# Patient Record
Sex: Male | Born: 1950 | Race: Black or African American | Hispanic: No | Marital: Married | Smoking: Former smoker
Health system: Southern US, Community
[De-identification: ages and names within clinical notes are randomized; demographics above are authoritative.]

## PROBLEM LIST (undated history)

## (undated) DIAGNOSIS — I10 Essential (primary) hypertension: Secondary | ICD-10-CM

## (undated) DIAGNOSIS — M549 Dorsalgia, unspecified: Secondary | ICD-10-CM

## (undated) HISTORY — PX: BACK SURGERY: SHX140

---

## 2004-02-19 ENCOUNTER — Emergency Department (HOSPITAL_COMMUNITY): Admission: EM | Admit: 2004-02-19 | Discharge: 2004-02-20 | Payer: Self-pay | Admitting: *Deleted

## 2004-04-04 ENCOUNTER — Ambulatory Visit (HOSPITAL_COMMUNITY): Admission: RE | Admit: 2004-04-04 | Discharge: 2004-04-04 | Payer: Self-pay | Admitting: Gastroenterology

## 2006-10-11 ENCOUNTER — Encounter: Admission: RE | Admit: 2006-10-11 | Discharge: 2006-10-11 | Payer: Self-pay | Admitting: Surgery

## 2006-10-12 ENCOUNTER — Ambulatory Visit (HOSPITAL_COMMUNITY): Admission: RE | Admit: 2006-10-12 | Discharge: 2006-10-12 | Payer: Self-pay | Admitting: Surgery

## 2011-12-24 ENCOUNTER — Ambulatory Visit (INDEPENDENT_AMBULATORY_CARE_PROVIDER_SITE_OTHER): Payer: BC Managed Care – PPO | Admitting: Emergency Medicine

## 2011-12-24 VITALS — BP 132/76 | HR 57 | Temp 98.2°F | Resp 16 | Ht 69.5 in | Wt 181.4 lb

## 2011-12-24 DIAGNOSIS — L247 Irritant contact dermatitis due to plants, except food: Secondary | ICD-10-CM

## 2011-12-24 DIAGNOSIS — L255 Unspecified contact dermatitis due to plants, except food: Secondary | ICD-10-CM

## 2011-12-24 MED ORDER — METHYLPREDNISOLONE ACETATE 40 MG/ML INJ SUSP (RADIOLOG
120.0000 mg | Freq: Once | INTRAMUSCULAR | Status: AC
  Administered 2011-12-24: 120 mg via INTRAMUSCULAR

## 2011-12-24 NOTE — Progress Notes (Signed)
   Patient Name: Eddie Dyer Date of Birth: [birth year] Medical Record Number: [id number] Gender: male Date of Encounter: 12/24/2011  Chief Complaint: Rash   History of Present Illness:  Eddie Dyer is a 61 y.o. very pleasant male patient who presents with the following:  Started HCTZ three months ago and has a pruritic raised migratory rash for the past two months.  Seen by a dermatologist a week ago who told him likely allergic.   Put on steroid cream and benadryl with no improvement.  Not able to sleep due to itching.  No other new allergens  There is no problem list on file for this patient.  No past medical history on file. No past surgical history on file. History  Substance Use Topics  . Smoking status: Former Games developer  . Smokeless tobacco: Not on file  . Alcohol Use: Not on file   No family history on file. No Known Allergies  Medication list has been reviewed and updated.  Current Outpatient Prescriptions on File Prior to Visit  Medication Sig Dispense Refill  . enalapril (VASOTEC) 10 MG tablet Take 10 mg by mouth daily.      . hydrochlorothiazide (HYDRODIURIL) 25 MG tablet Take 25 mg by mouth daily.        Review of Systems:  As per HPI, otherwise negative.    Physical Examination: Filed Vitals:   12/24/11 0900  BP: 132/76  Pulse: 57  Temp: 98.2 F (36.8 C)  Resp: 16   Filed Vitals:   12/24/11 0900  Height: 5' 9.5" (1.765 m)  Weight: 181 lb 6.4 oz (82.283 kg)   Body mass index is 26.40 kg/(m^2). Ideal Body Weight: Weight in (lb) to have BMI = 25: 171.4    GEN: WDWN, NAD, Non-toxic, Alert & Oriented x 3 HEENT: Atraumatic, Normocephalic.  Ears and Nose: No external deformity. EXTR: No clubbing/cyanosis/edema NEURO: Normal gait.  PSYCH: Normally interactive. Conversant. Not depressed or anxious appearing.  Calm demeanor.  Generalized excoriated erythematous eruption  EKG / Labs / Xrays: None available at time of encounter  Assessment and  Plan:   Allergic dermatitis.  Stop HCTZ  Carmelina Dane, MD

## 2014-09-07 DIAGNOSIS — H2513 Age-related nuclear cataract, bilateral: Secondary | ICD-10-CM | POA: Diagnosis not present

## 2014-09-07 DIAGNOSIS — H524 Presbyopia: Secondary | ICD-10-CM | POA: Diagnosis not present

## 2014-09-07 DIAGNOSIS — H5203 Hypermetropia, bilateral: Secondary | ICD-10-CM | POA: Diagnosis not present

## 2014-09-08 DIAGNOSIS — J069 Acute upper respiratory infection, unspecified: Secondary | ICD-10-CM | POA: Diagnosis not present

## 2014-09-08 DIAGNOSIS — L509 Urticaria, unspecified: Secondary | ICD-10-CM | POA: Diagnosis not present

## 2014-09-08 DIAGNOSIS — J300 Vasomotor rhinitis: Secondary | ICD-10-CM | POA: Diagnosis not present

## 2014-11-17 DIAGNOSIS — I10 Essential (primary) hypertension: Secondary | ICD-10-CM | POA: Diagnosis not present

## 2014-11-23 DIAGNOSIS — L509 Urticaria, unspecified: Secondary | ICD-10-CM | POA: Diagnosis not present

## 2014-11-23 DIAGNOSIS — R05 Cough: Secondary | ICD-10-CM | POA: Diagnosis not present

## 2014-11-23 DIAGNOSIS — J300 Vasomotor rhinitis: Secondary | ICD-10-CM | POA: Diagnosis not present

## 2015-04-13 ENCOUNTER — Other Ambulatory Visit: Payer: Self-pay | Admitting: Family Medicine

## 2015-04-13 DIAGNOSIS — I714 Abdominal aortic aneurysm, without rupture, unspecified: Secondary | ICD-10-CM

## 2015-04-20 ENCOUNTER — Ambulatory Visit: Payer: Self-pay

## 2016-09-06 ENCOUNTER — Encounter (HOSPITAL_COMMUNITY): Payer: Self-pay | Admitting: Emergency Medicine

## 2016-09-06 ENCOUNTER — Emergency Department (HOSPITAL_COMMUNITY): Payer: Medicare Other

## 2016-09-06 ENCOUNTER — Emergency Department (HOSPITAL_COMMUNITY)
Admission: EM | Admit: 2016-09-06 | Discharge: 2016-09-06 | Disposition: A | Discharge status: Home / Self Care | Payer: Medicare Other | Attending: Emergency Medicine | Admitting: Emergency Medicine

## 2016-09-06 DIAGNOSIS — Z87891 Personal history of nicotine dependence: Secondary | ICD-10-CM | POA: Diagnosis not present

## 2016-09-06 DIAGNOSIS — X509XXA Other and unspecified overexertion or strenuous movements or postures, initial encounter: Secondary | ICD-10-CM | POA: Diagnosis not present

## 2016-09-06 DIAGNOSIS — Z7982 Long term (current) use of aspirin: Secondary | ICD-10-CM | POA: Insufficient documentation

## 2016-09-06 DIAGNOSIS — Y999 Unspecified external cause status: Secondary | ICD-10-CM | POA: Insufficient documentation

## 2016-09-06 DIAGNOSIS — M545 Low back pain, unspecified: Secondary | ICD-10-CM

## 2016-09-06 DIAGNOSIS — I10 Essential (primary) hypertension: Secondary | ICD-10-CM | POA: Insufficient documentation

## 2016-09-06 DIAGNOSIS — Y939 Activity, unspecified: Secondary | ICD-10-CM | POA: Diagnosis not present

## 2016-09-06 DIAGNOSIS — Y929 Unspecified place or not applicable: Secondary | ICD-10-CM | POA: Insufficient documentation

## 2016-09-06 HISTORY — DX: Essential (primary) hypertension: I10

## 2016-09-06 HISTORY — DX: Dorsalgia, unspecified: M54.9

## 2016-09-06 LAB — I-STAT CHEM 8, ED
BUN: 25 mg/dL — AB (ref 6–20)
CALCIUM ION: 1.08 mmol/L — AB (ref 1.15–1.40)
Chloride: 103 mmol/L (ref 101–111)
Creatinine, Ser: 1 mg/dL (ref 0.61–1.24)
Glucose, Bld: 100 mg/dL — ABNORMAL HIGH (ref 65–99)
HCT: 43 % (ref 39.0–52.0)
HEMOGLOBIN: 14.6 g/dL (ref 13.0–17.0)
Potassium: 4.6 mmol/L (ref 3.5–5.1)
SODIUM: 139 mmol/L (ref 135–145)
TCO2: 28 mmol/L (ref 0–100)

## 2016-09-06 LAB — I-STAT TROPONIN, ED: TROPONIN I, POC: 0 ng/mL (ref 0.00–0.08)

## 2016-09-06 MED ORDER — OXYCODONE-ACETAMINOPHEN 5-325 MG PO TABS
1.0000 | ORAL_TABLET | ORAL | Status: AC | PRN
Start: 2016-09-06 — End: 2016-09-06
  Administered 2016-09-06 (×2): 1 via ORAL
  Filled 2016-09-06: qty 1

## 2016-09-06 MED ORDER — PREDNISONE 10 MG (21) PO TBPK: ORAL_TABLET | Freq: Every day | ORAL | 0 refills | Status: DC

## 2016-09-06 MED ORDER — OXYCODONE-ACETAMINOPHEN 5-325 MG PO TABS
ORAL_TABLET | ORAL | Status: AC
  Filled 2016-09-06: qty 1

## 2016-09-06 MED ORDER — CYCLOBENZAPRINE HCL 10 MG PO TABS: 10.0000 mg | ORAL_TABLET | Freq: Three times a day (TID) | ORAL | 0 refills | Status: AC | PRN

## 2016-09-06 MED ORDER — KETOROLAC TROMETHAMINE 60 MG/2ML IM SOLN
30.0000 mg | Freq: Once | INTRAMUSCULAR | Status: AC
  Administered 2016-09-06: 30 mg via INTRAMUSCULAR
  Filled 2016-09-06: qty 2

## 2016-09-06 NOTE — ED Provider Notes (Signed)
[facility]-EMERGENCY DEPT Provider Note   CSN: [id number] Arrival date & time: 09/06/16  0131     History   Chief Complaint Chief Complaint  Patient presents with  . Back Pain    HPI Eddie Dyer is a 66 y.o. male who presents today with 2 day history of right sided low back pain. Pt states he was gardening yesterday and turned to reach for something behind him when he felt "my back seize". States he has 8/10 right sided sharp pain whenever he stands up or changed positions that require him to flex his hip. Associated with intermittent anterior thigh numbness but no weakness or tinglingStates he experienced a similar episode 3 years ago and says "I was told I had a bone spur in my back". He has tried voltaren with no relief. Denies bowel/bladder incontinence, gait abnormalities or coordination difficulties, fever/chills, abd pain, n/v/d, dysuria, hematuria. Also denies HA, dizziness, CP/SOB, melena. No hx of kidney stones.   The history is provided by the patient.    Past Medical History:  Diagnosis Date  . Back pain   . Hypertension     There are no active problems to display for this patient.   Past Surgical History:  Procedure Laterality Date  . BACK SURGERY         Home Medications    Prior to Admission medications   Medication Sig Start Date End Date Taking? Authorizing Provider  aspirin 81 MG tablet Take 81 mg by mouth daily.   Yes Historical Provider, MD  diclofenac (VOLTAREN) 75 MG EC tablet Take 75 mg by mouth 2 (two) times daily as needed for mild pain.   Yes Historical Provider, MD  enalapril (VASOTEC) 10 MG tablet Take 20 mg by mouth daily.    Yes Historical Provider, MD  hydrochlorothiazide (HYDRODIURIL) 25 MG tablet Take 12.5 mg by mouth daily.    Yes Historical Provider, MD  Multiple Vitamins-Minerals (MULTIVITAMIN WITH MINERALS) tablet Take 1 tablet by mouth daily.   Yes Historical Provider, MD  cyclobenzaprine (FLEXERIL) 10 MG tablet Take 1 tablet (10 mg  total) by mouth 3 (three) times daily as needed for muscle spasms. 09/06/16 09/09/16  Epic Tribbett A Malon Branton, PA  predniSONE (STERAPRED UNI-PAK 21 TAB) 10 MG (21) TBPK tablet Take by mouth daily. Take 6 tabs by mouth daily  for 2 days, then 5 tabs for 2 days, then 4 tabs for 2 days, then 3 tabs for 2 days, 2 tabs for 2 days, then 1 tab by mouth daily for 2 days 09/06/16   Jeanie Sewer, PA    Family History History reviewed. No pertinent family history.  Social History Social History  Substance Use Topics  . Smoking status: Former Games developer  . Smokeless tobacco: Not on file  . Alcohol use Yes     Comment: occ     Allergies   Patient has no known allergies.   Review of Systems Review of Systems  Constitutional: Negative for chills and fever.  Respiratory: Negative for shortness of breath.   Cardiovascular: Negative for chest pain and leg swelling.  Gastrointestinal: Negative for abdominal pain, constipation, diarrhea, nausea and vomiting.  Genitourinary: Negative for dysuria, flank pain and hematuria.  Musculoskeletal: Positive for arthralgias, back pain and myalgias. Negative for gait problem, joint swelling and neck pain.  Neurological: Positive for numbness. Negative for dizziness and headaches.     Physical Exam Updated Vital Signs BP (!) 151/90   Pulse (!) 54   Temp 98.3 F (  36.8 C) (Oral)   Resp 18   Ht 5\' 10"  (1.778 m)   Wt 81.6 kg   SpO2 94%   BMI 25.83 kg/m   Physical Exam  Constitutional: He is oriented to person, place, and time. He appears well-developed and well-nourished. No distress.  HENT:  Head: Normocephalic and atraumatic.  Eyes: Conjunctivae are normal. Right eye exhibits no discharge. Left eye exhibits no discharge. No scleral icterus.  Neck: Neck supple. No JVD present. No tracheal deviation present. No thyromegaly present.  Cardiovascular: Normal rate, regular rhythm, normal heart sounds and intact distal pulses.   2+ dp/pt pulses BL  Pulmonary/Chest: Effort  normal and breath sounds normal.  Abdominal: Soft. He exhibits no distension. There is no tenderness.  Genitourinary:  Genitourinary Comments: No CVA tenderness or flank pain  Musculoskeletal: He exhibits tenderness. He exhibits no edema or deformity.  ROM of hips BL normal, negative FABER test. TTP at right SI joint, SI dysfunction noted on flexion of LSP. No ttp of midline spine. Negative straight leg raise. 5/5 strength in BLE. Antalgic gait, but pt able to heel walk and toe walk without balance or coordination issues  Neurological: He is alert and oriented to person, place, and time. No sensory deficit.  Skin: Skin is warm and dry. Capillary refill takes less than 2 seconds. He is not diaphoretic.  Psychiatric: He has a normal mood and affect. His behavior is normal. Judgment and thought content normal.     ED Treatments / Results  Labs (all labs ordered are listed, but only abnormal results are displayed) Labs Reviewed  I-STAT CHEM 8, ED - Abnormal; Notable for the following:       Result Value   BUN 25 (*)    Glucose, Bld 100 (*)    Calcium, Ion 1.08 (*)    All other components within normal limits  I-STAT TROPOININ, ED    EKG  EKG Interpretation None       Radiology Dg Lumbar Spine Complete  Result Date: 09/06/2016 CLINICAL DATA:  Twisted back few days ago Felt sharp pain in lower back and post rt leg pain is worse toady. EXAM: LUMBAR SPINE - COMPLETE 4+ VIEW COMPARISON:  None. FINDINGS: There are 5 nonrib bearing lumbar-type vertebral bodies. The vertebral body heights are maintained. 2 mm anterolisthesis of L3 on L4. There is a mild levocurvature of the lumbar spine. There is no spondylolysis. There is no acute fracture. There is degenerative disc disease with disc height loss at L4-5 and L5-S1 with bilateral facet arthropathy. Bilateral facet arthropathy at L3-4. The SI joints are unremarkable. IMPRESSION: 1.  No acute osseous injury of the lumbar spine. Electronically  Signed   By: Elige KoHetal  Patel   On: 09/06/2016 08:06    Procedures Procedures (including critical care time)  Medications Ordered in ED Medications  oxyCODONE-acetaminophen (PERCOCET/ROXICET) 5-325 MG per tablet (not administered)  oxyCODONE-acetaminophen (PERCOCET/ROXICET) 5-325 MG per tablet 1 tablet (1 tablet Oral Given 09/06/16 0722)  ketorolac (TORADOL) injection 30 mg (30 mg Intramuscular Given 09/06/16 0850)     Initial Impression / Assessment and Plan / ED Course  I have reviewed the triage vital signs and the nursing notes.  Pertinent labs & imaging results that were available during my care of the patient were reviewed by me and considered in my medical decision making (see chart for details).     66yom presents to ED with chief complaint low back pain since yesterday, with onset after gardening and turning to  grab something. Pt afebrile, VS baseline for pt. LSP xrays show no acute osseous injury but degenerative discs and facet arthropathy.  Pt received percocet in ED and toradol IM which he states was helpful.  In absence of gait abnormalities, bowel/bladder incontinence, and fever low suspicion of CES or spinal abscess. Low suspicion of nehprolithiasis, pyelonephritis, or acute abdominal pathology in absence of fever, CVA tenderness/flank pain, or abdominal pain. Likely myofascial in nature or sacroillitis. Pt will take steroid taper for inflammation, and provided with flexeril prn for muscle spasms. Pt will follow up with PCP within 1 week for re-evaluation; discussed the role of physical therapy. Discussed ED return precautions including development of fever, bowel/bladder incontinence, and gait disturbances.  Final Clinical Impressions(s) / ED Diagnoses   Final diagnoses:  Acute right-sided low back pain without sciatica    New Prescriptions New Prescriptions   CYCLOBENZAPRINE (FLEXERIL) 10 MG TABLET    Take 1 tablet (10 mg total) by mouth 3 (three) times daily as needed  for muscle spasms.   PREDNISONE (STERAPRED UNI-PAK 21 TAB) 10 MG (21) TBPK TABLET    Take by mouth daily. Take 6 tabs by mouth daily  for 2 days, then 5 tabs for 2 days, then 4 tabs for 2 days, then 3 tabs for 2 days, 2 tabs for 2 days, then 1 tab by mouth daily for 2 days     Jeanie Sewer, [place] 09/06/16 1002    Tomasita Crumble, MD 09/06/16 1222

## 2016-09-06 NOTE — ED Notes (Signed)
Patient states he has "a bone spur in his back " reached around to get something yest and felt a catch in the left side of his back . States he just couldn't get comfortable. States since being in the ed and he was given a pain pill he feels much better.;

## 2016-09-06 NOTE — ED Triage Notes (Signed)
Pt presents with R flank pain that began yesterday evening; pt denies urinary symptoms, denies n/v/d, denies fever, diaphoresis, lightheadedness, also denies injury

## 2016-09-06 NOTE — ED Notes (Signed)
Patient transported to X-ray 

## 2016-09-06 NOTE — ED Notes (Signed)
Returned from  X-ray

## 2016-09-14 ENCOUNTER — Other Ambulatory Visit: Payer: Self-pay | Admitting: Family Medicine

## 2016-09-14 DIAGNOSIS — Z136 Encounter for screening for cardiovascular disorders: Secondary | ICD-10-CM

## 2016-09-25 ENCOUNTER — Ambulatory Visit: Payer: Medicare (Managed Care)

## 2018-05-06 ENCOUNTER — Ambulatory Visit
Admission: RE | Admit: 2018-05-06 | Discharge: 2018-05-06 | Disposition: A | Discharge status: Home / Self Care | Payer: Medicare Other | Source: Ambulatory Visit | Attending: Family Medicine | Admitting: Family Medicine

## 2018-05-06 ENCOUNTER — Other Ambulatory Visit: Payer: Self-pay | Admitting: Family Medicine

## 2018-05-06 DIAGNOSIS — J329 Chronic sinusitis, unspecified: Secondary | ICD-10-CM

## 2020-10-16 ENCOUNTER — Emergency Department (HOSPITAL_COMMUNITY)
Admission: EM | Admit: 2020-10-16 | Discharge: 2020-10-17 | Disposition: A | Discharge status: Home / Self Care | Payer: Medicare Other | Attending: Emergency Medicine | Admitting: Emergency Medicine

## 2020-10-16 ENCOUNTER — Emergency Department (HOSPITAL_COMMUNITY): Payer: Medicare Other

## 2020-10-16 ENCOUNTER — Other Ambulatory Visit: Payer: Self-pay

## 2020-10-16 ENCOUNTER — Encounter (HOSPITAL_COMMUNITY): Payer: Self-pay

## 2020-10-16 DIAGNOSIS — Z79899 Other long term (current) drug therapy: Secondary | ICD-10-CM | POA: Diagnosis not present

## 2020-10-16 DIAGNOSIS — R1013 Epigastric pain: Secondary | ICD-10-CM

## 2020-10-16 DIAGNOSIS — R0602 Shortness of breath: Secondary | ICD-10-CM | POA: Insufficient documentation

## 2020-10-16 DIAGNOSIS — R109 Unspecified abdominal pain: Secondary | ICD-10-CM | POA: Diagnosis present

## 2020-10-16 DIAGNOSIS — R064 Hyperventilation: Secondary | ICD-10-CM | POA: Diagnosis not present

## 2020-10-16 DIAGNOSIS — I10 Essential (primary) hypertension: Secondary | ICD-10-CM | POA: Diagnosis not present

## 2020-10-16 DIAGNOSIS — Z7982 Long term (current) use of aspirin: Secondary | ICD-10-CM | POA: Insufficient documentation

## 2020-10-16 DIAGNOSIS — K5790 Diverticulosis of intestine, part unspecified, without perforation or abscess without bleeding: Secondary | ICD-10-CM

## 2020-10-16 DIAGNOSIS — K5900 Constipation, unspecified: Secondary | ICD-10-CM | POA: Diagnosis not present

## 2020-10-16 DIAGNOSIS — Z87891 Personal history of nicotine dependence: Secondary | ICD-10-CM | POA: Insufficient documentation

## 2020-10-16 LAB — CBC
HCT: 41.1 % (ref 39.0–52.0)
Hemoglobin: 13.6 g/dL (ref 13.0–17.0)
MCH: 32.5 pg (ref 26.0–34.0)
MCHC: 33.1 g/dL (ref 30.0–36.0)
MCV: 98.1 fL (ref 80.0–100.0)
Platelets: 224 10*3/uL (ref 150–400)
RBC: 4.19 MIL/uL — ABNORMAL LOW (ref 4.22–5.81)
RDW: 11.7 % (ref 11.5–15.5)
WBC: 11.9 10*3/uL — ABNORMAL HIGH (ref 4.0–10.5)
nRBC: 0 % (ref 0.0–0.2)

## 2020-10-16 LAB — COMPREHENSIVE METABOLIC PANEL
ALT: 19 U/L (ref 0–44)
AST: 26 U/L (ref 15–41)
Albumin: 3.8 g/dL (ref 3.5–5.0)
Alkaline Phosphatase: 53 U/L (ref 38–126)
Anion gap: 10 (ref 5–15)
BUN: 22 mg/dL (ref 8–23)
CO2: 22 mmol/L (ref 22–32)
Calcium: 8.6 mg/dL — ABNORMAL LOW (ref 8.9–10.3)
Chloride: 102 mmol/L (ref 98–111)
Creatinine, Ser: 1.08 mg/dL (ref 0.61–1.24)
GFR, Estimated: >60 mL/min (ref >60)
Glucose, Bld: 138 mg/dL — ABNORMAL HIGH (ref 70–99)
Potassium: 3.8 mmol/L (ref 3.5–5.1)
Sodium: 134 mmol/L — ABNORMAL LOW (ref 135–145)
Total Bilirubin: 0.7 mg/dL (ref 0.3–1.2)
Total Protein: 6.7 g/dL (ref 6.5–8.1)

## 2020-10-16 LAB — LIPASE, BLOOD: Lipase: 28 U/L (ref 11–51)

## 2020-10-16 LAB — TROPONIN I (HIGH SENSITIVITY)
Troponin I (High Sensitivity): 5 ng/L (ref <18)
Troponin I (High Sensitivity): 4 ng/L (ref <18)

## 2020-10-16 MED ORDER — FAMOTIDINE 20 MG PO TABS
20.0000 mg | ORAL_TABLET | Freq: Once | ORAL | Status: AC
  Administered 2020-10-16: 20 mg via ORAL
  Filled 2020-10-16: qty 1

## 2020-10-16 MED ORDER — ALUM & MAG HYDROXIDE-SIMETH 200-200-20 MG/5ML PO SUSP
30.0000 mL | Freq: Once | ORAL | Status: AC
  Administered 2020-10-16: 30 mL via ORAL
  Filled 2020-10-16: qty 30

## 2020-10-16 MED ORDER — IOHEXOL 300 MG/ML  SOLN
80.0000 mL | Freq: Once | INTRAMUSCULAR | Status: AC | PRN
  Administered 2020-10-16: 80 mL via INTRAVENOUS

## 2020-10-16 NOTE — ED Triage Notes (Signed)
Emergency Medicine Provider Triage Evaluation Note  Eddie Dyer , a 70 y.o. male  was evaluated in triage.  Pt complains of indigestion, ?heartburn, burping.   Review of Systems  Positive:       Heartburn, indigestion, felt lightheaded Negative: No Palpitations. No Sob. No abd pain. Had bm this AM  Physical Exam  BP (!) 155/98 (BP Location: Right Arm)   Pulse 72   Temp 97.9 F (36.6 C) (Oral)   Resp 18   SpO2 100%  Gen:   Awake, no distress   HEENT:  Atraumatic  Resp:  Normal effort  Cardiac:  Normal rate  Abd:   Nondistended, nontender  Neuro:  Speech clear   Medical Decision Making  Medically screening exam initiated at 7:08 PM.  Appropriate orders placed.  Eddie Dyer was informed that the remainder of the evaluation will be completed by another provider, this initial triage assessment does not replace that evaluation, and the importance of remaining in the ED until their evaluation is complete.  Clinical Impression  ECG. Labs ordered. Will move to treatment room as soon as available.   pepcid and maalox for symptom relief.    Cathren Laine, MD 10/16/20 1910

## 2020-10-16 NOTE — ED Triage Notes (Signed)
Patient arrives with [facility], was driving down the road and had sudden onset shortness of breath, indigestion, burping, states his hands went numb and he felt dizzy. Reports two episodes since initial episode. Denies chest pain, hx of HTN. EMS gave 324mg  ASA, 20 L wrist.

## 2020-10-17 MED ORDER — OMEPRAZOLE 20 MG PO CPDR: 20.0000 mg | DELAYED_RELEASE_CAPSULE | Freq: Every day | ORAL | 0 refills | Status: DC

## 2020-10-17 NOTE — ED Provider Notes (Signed)
[facility] EMERGENCY DEPARTMENT Provider Note   CSN: [id number] Arrival date & time: 10/16/20  1902     History Chief Complaint  Patient presents with  . Shortness of Breath  . Gastroesophageal Reflux  . Constipation    Eddie Dyer is a 70 y.o. male.  HPI Patient reports he had been working outside around the [employer] most of the day.  He has been cleaning and painting.  He had not had any symptoms.  He went in changed and showered feeling normal.  He and his wife were in the car driving to dinner when he suddenly felt like he was having indigestion with a fullness in his upper abdomen.  He was having a lot of belching and then started to feel like he was slightly dizzy and short of breath.  He pulled to the side of the road and he felt like both hands were tingling and then they both kind of cramped and he could not close them.  They called EMS.  Symptoms were improving by the time EMS arrived.  Patient reports he had not eaten anything since breakfast.  He had been taking a 2-week course of Prilosec but has not had any in about 3 weeks.  Reports he has been feeling persistently like he is got some abdominal distention.  He has perceived himself to be constipated.  He reports at baseline he has 3 bowel movements a day.  He reports that he has only had 1 or 2 bowel movements now and thinks that he might be constipated.     Past Medical History:  Diagnosis Date  . Back pain   . Hypertension     There are no problems to display for this patient.   Past Surgical History:  Procedure Laterality Date  . BACK SURGERY         History reviewed. No pertinent family history.  Social History   Tobacco Use  . Smoking status: Former Games developer  . Smokeless tobacco: Never Used  Substance Use Topics  . Alcohol use: Yes    Comment: occ  . Drug use: No    Home Medications Prior to Admission medications   Medication Sig Start Date End Date Taking? Authorizing Provider   aspirin 81 MG tablet Take 81 mg by mouth daily.   Yes [provider]  diclofenac (VOLTAREN) 75 MG EC tablet Take 75 mg by mouth 2 (two) times daily as needed for mild pain.   Yes [provider]  enalapril (VASOTEC) 10 MG tablet Take 20 mg by mouth daily.    Yes [provider]  fluticasone (FLONASE) 50 MCG/ACT nasal spray Place 1 spray into both nostrils daily as needed for allergies.   Yes [provider]  hydrochlorothiazide (HYDRODIURIL) 25 MG tablet Take 12.5 mg by mouth daily.    Yes [provider]  Lactobacillus (ACIDOPHILUS PO) Take 1 capsule by mouth daily.   Yes [provider]  Multiple Vitamins-Minerals (MULTIVITAMIN WITH MINERALS) tablet Take 1 tablet by mouth daily.   Yes [provider]  omeprazole (PRILOSEC) 20 MG capsule Take 1 capsule (20 mg total) by mouth daily. 10/17/20  Yes Arby Barrette, MD    Allergies    Patient has no known allergies.  Review of Systems   Review of Systems 10 systems reviewed and negative except as per HPI Physical Exam Updated Vital Signs BP (!) 142/77   Pulse (!) 54   Temp 97.9 F (36.6 C) (Oral)  Resp 20   Ht 5\' 10"  (1.778 m)   Wt 80.7 kg   SpO2 98%   BMI 25.54 kg/m   Physical Exam Constitutional:      Appearance: He is well-developed.  HENT:     Head: Normocephalic and atraumatic.  Eyes:     Pupils: Pupils are equal, round, and reactive to light.  Cardiovascular:     Rate and Rhythm: Normal rate and regular rhythm.     Heart sounds: Normal heart sounds.  Pulmonary:     Effort: Pulmonary effort is normal.     Breath sounds: Normal breath sounds.  Abdominal:     General: Bowel sounds are normal. There is no distension.     Palpations: Abdomen is soft.     Tenderness: There is no abdominal tenderness.  Musculoskeletal:        General: Normal range of motion.     Cervical back: Neck supple.  Skin:    General: Skin is warm and dry.  Neurological:     Mental  Status: He is alert and oriented to person, place, and time.     GCS: GCS eye subscore is 4. GCS verbal subscore is 5. GCS motor subscore is 6.     Coordination: Coordination normal.     ED Results / Procedures / Treatments   Labs (all labs ordered are listed, but only abnormal results are displayed) Labs Reviewed  CBC - Abnormal; Notable for the following components:      Result Value   WBC 11.9 (*)    RBC 4.19 (*)    All other components within normal limits  COMPREHENSIVE METABOLIC PANEL - Abnormal; Notable for the following components:   Sodium 134 (*)    Glucose, Bld 138 (*)    Calcium 8.6 (*)    All other components within normal limits  LIPASE, BLOOD  TROPONIN I (HIGH SENSITIVITY)  TROPONIN I (HIGH SENSITIVITY)    EKG EKG Interpretation  Date/Time:  Saturday October 16 2020 19:11:54 EDT Ventricular Rate:  67 PR Interval:  188 QRS Duration: 94 QT Interval:  416 QTC Calculation: 439 R Axis:   77 Text Interpretation: Normal sinus rhythm Normal ECG no change from previous Confirmed by 07-22-1970 [phone]) on 10/16/2020 10:08:14 PM   Radiology DG Chest 2 View  Result Date: 10/16/2020 CLINICAL DATA:  Initial evaluation for acute shortness of breath. EXAM: CHEST - 2 VIEW COMPARISON:  Prior radiograph from 10/09/2006. FINDINGS: The cardiac and mediastinal silhouettes are stable in size and contour, and remain within normal limits. The lungs are normally inflated. No airspace consolidation, pleural effusion, or pulmonary edema. No pneumothorax. No acute osseous abnormality. IMPRESSION: No active cardiopulmonary disease. Electronically Signed   By: 10/11/2006 M.D.   On: 10/16/2020 23:26   CT Abdomen Pelvis W Contrast  Result Date: 10/16/2020 CLINICAL DATA:  Sudden onset shortness of breath, dizziness, abdominal distension EXAM: CT ABDOMEN AND PELVIS WITH CONTRAST TECHNIQUE: Multidetector CT imaging of the abdomen and pelvis was performed using the standard protocol  following bolus administration of intravenous contrast. CONTRAST:  75mL OMNIPAQUE IOHEXOL 300 MG/ML  SOLN COMPARISON:  None. FINDINGS: Lower chest: No acute pleural or parenchymal lung disease. Hepatobiliary: No focal liver abnormality is seen. No gallstones, gallbladder wall thickening, or biliary dilatation. Pancreas: Unremarkable. No pancreatic ductal dilatation or surrounding inflammatory changes. Spleen: Normal in size without focal abnormality. Adrenals/Urinary Tract: Adrenal glands are unremarkable. Kidneys are normal, without renal calculi, focal lesion, or hydronephrosis. Bladder is unremarkable. Stomach/Bowel:  No bowel obstruction or ileus. Normal appendix right lower quadrant. No bowel wall thickening or inflammatory change. Scattered sigmoid diverticulosis without diverticulitis. Vascular/Lymphatic: Aortic atherosclerosis. No enlarged abdominal or pelvic lymph nodes. Reproductive: Prostate is unremarkable. Other: No free fluid or free gas.  No abdominal wall hernia. Musculoskeletal: No acute or destructive bony lesions. Reconstructed images demonstrate no additional findings. IMPRESSION: 1. Sigmoid diverticulosis without diverticulitis. 2. No acute intra-abdominal or intrapelvic process. 3.  Aortic Atherosclerosis (ICD10-I70.0). Electronically Signed   By: Sharlet Salina M.D.   On: 10/16/2020 23:23    Procedures Procedures   Medications Ordered in ED Medications  alum & mag hydroxide-simeth (MAALOX/MYLANTA) 200-200-20 MG/5ML suspension 30 mL (30 mLs Oral Given 10/16/20 2217)  famotidine (PEPCID) tablet 20 mg (20 mg Oral Given 10/16/20 2216)  iohexol (OMNIPAQUE) 300 MG/ML solution 80 mL (80 mLs Intravenous Contrast Given 10/16/20 2241)    ED Course  I have reviewed the triage vital signs and the nursing notes.  Pertinent labs & imaging results that were available during my care of the patient were reviewed by me and considered in my medical decision making (see chart for details).    MDM  Rules/Calculators/A&P                          At time of evaluation, symptoms are significantly improved.  Patient still perceives some epigastric distention and fullness.  He denies he ever had chest pain per se.  Patient was active all day without any exertional chest pain or unusual shortness of breath.  Symptoms occurred at rest and escalated quickly.  Description, it sounds as though patient had hyperventilation and then carpal spasm with paresthesias.  Although symptoms have resolved.  EKG does not show acute ischemic changes and 2 sets of troponins are negative.  Patient was concern for constipation and abdominal distention.  CT scans do not show any significant acute processes.  Patient has diverticulosis without diverticulitis.  I provided some education on this diagnosis.  At this time I have suspicion for reflux with the patient perceiving fullness in the upper abdomen and a lot of belching.  Recommendations have been given for taking a continuous dose of Prilosec and following reflux instructions.  Nonetheless, we did review risks for possible coronary artery disease given patient's age.  He is going to be following up with his PCP and discuss follow-up stress test.  Precautions reviewed. Final Clinical Impression(s) / ED Diagnoses Final diagnoses:  Epigastric pain  Diverticulosis  Hyperventilation    Rx / DC Orders ED Discharge Orders         Ordered    omeprazole (PRILOSEC) 20 MG capsule  Daily        10/17/20 0016           Arby Barrette, MD 10/17/20 9150

## 2020-10-17 NOTE — ED Notes (Signed)
Patient given discharge paperwork and prescription. Verbalized understanding of teaching. IV d/c with cath tip intact. Ambulatory to exit in NAD with steady gait.

## 2020-10-17 NOTE — Discharge Instructions (Signed)
1.  Take Prilosec daily.  Take it 30 minutes before you eat in the morning.  Follow dietary instructions for gastroesophageal reflux disease. 2.  Your evaluation in the emergency department tonight does not show evidence of a heart attack.  It is still possible to be  developing heart disease.  It is very important you follow-up with your doctor for further evaluation.  Discuss getting a cardiac stress test. 3.  You have diverticulosis.  You can read instructions and information in your discharge instructions about this diagnosis.  It is not causing you any symptoms at this time. 4.  When you hyperventilate, it can cause tingling in both hands and around the mouth.  I suspect that your episode in the car with tingling was associated with discomfort in your upper abdomen and feeling of shortness of breath with hyperventilation. 5.  Return to the emergency department immediately if you develop chest pain, lightheadedness, unusual nausea and weakness or other concerning symptoms.

## 2021-12-27 IMAGING — CT CT ABD-PELV W/ CM
2 of 5 series · 16 of 46 positions shown, 18 images · IV contrast (omnipaque)
Comparison: None.

CLINICAL DATA: Sudden onset shortness of breath, dizziness,
abdominal distension

EXAM:
CT ABDOMEN AND PELVIS WITH CONTRAST
TECHNIQUE: Multidetector CT imaging of the abdomen and pelvis was performed
using the standard protocol following bolus administration of
intravenous contrast.
CONTRAST:  80mL OMNIPAQUE IOHEXOL 300 MG/ML  SOLN

[Series 3: a/p w/ 5mm · axial · 0.80mm/px · z∈[-460,-15]mm · 13 of 99 slices shown, 15 images]
[im 5/99  soft-tissue]
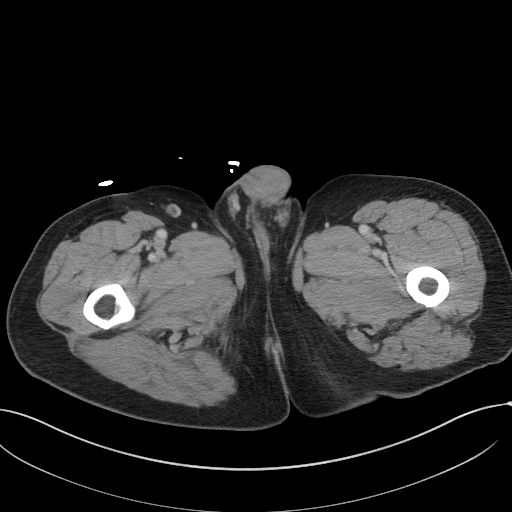
[im 5/99  bone]
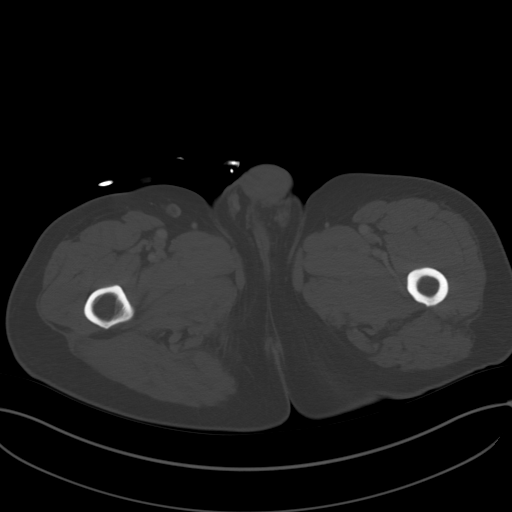
[im 15/99  soft-tissue]
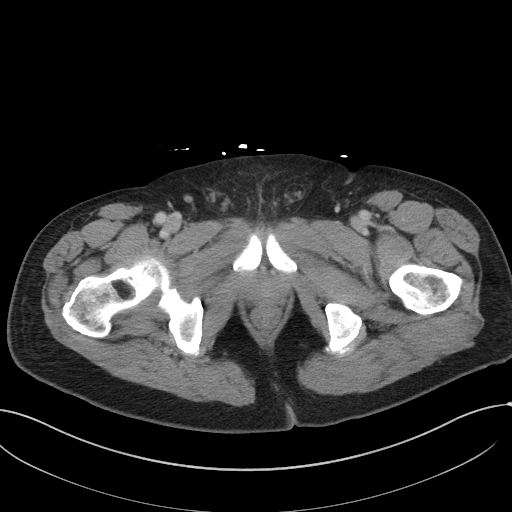
[im 20/99  soft-tissue]
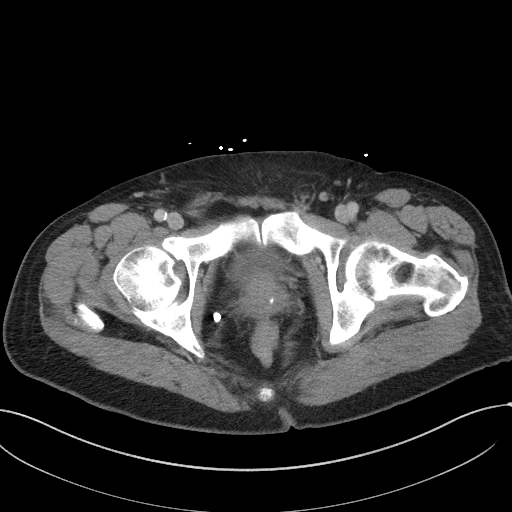
[im 30/99  soft-tissue]
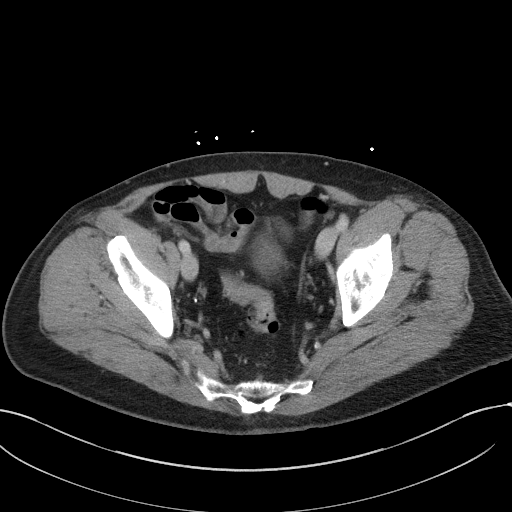
[im 35/99  soft-tissue]
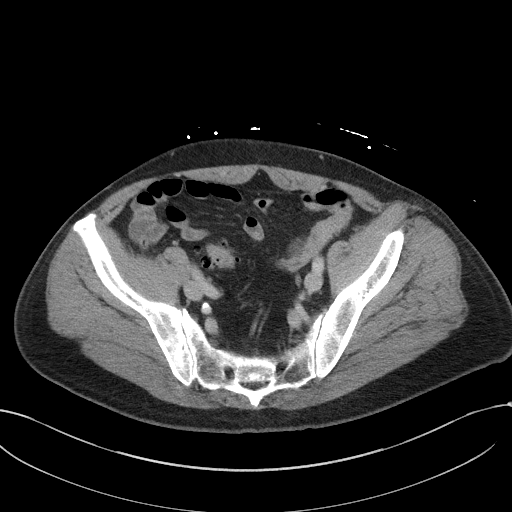
[im 45/99  soft-tissue]
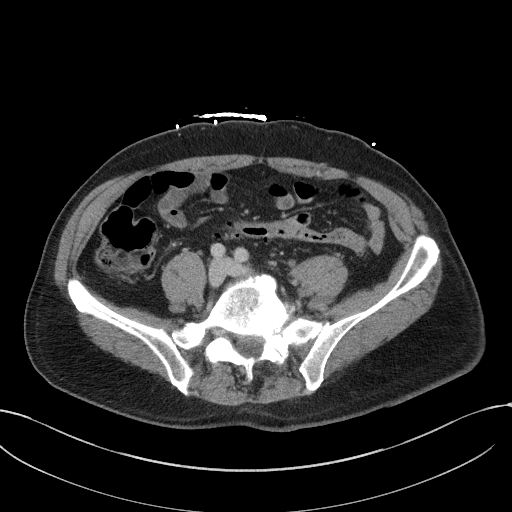
[im 50/99  soft-tissue]
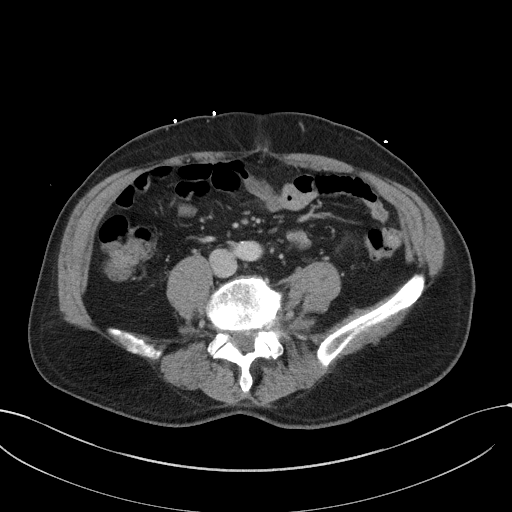
[im 54/99  soft-tissue]
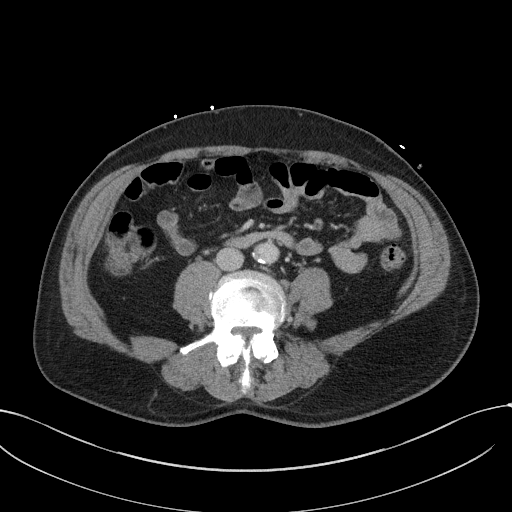
[im 64/99  soft-tissue]
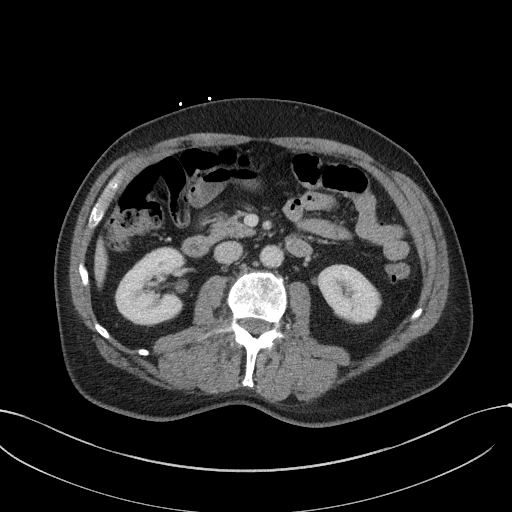
[im 64/99  bone]
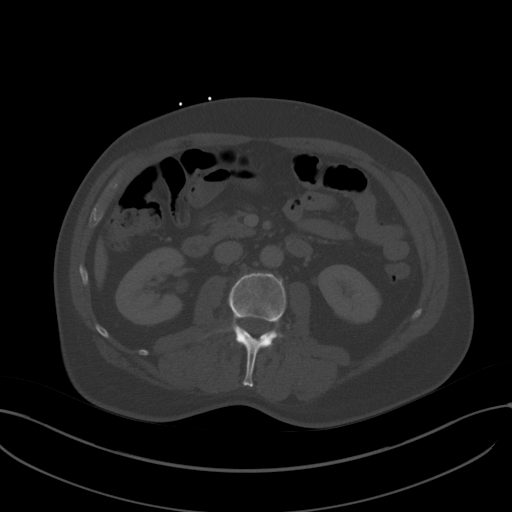
[im 69/99  soft-tissue]
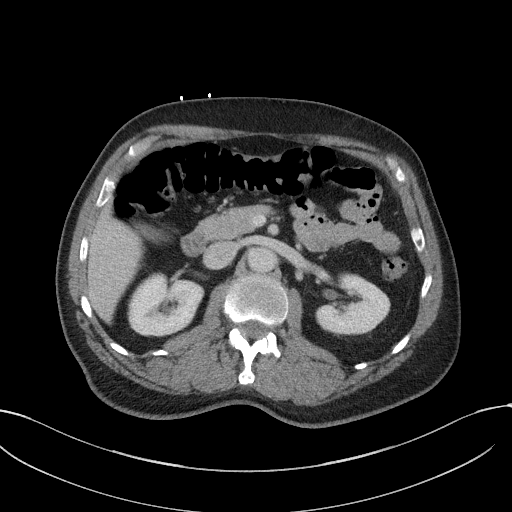
[im 79/99  soft-tissue]
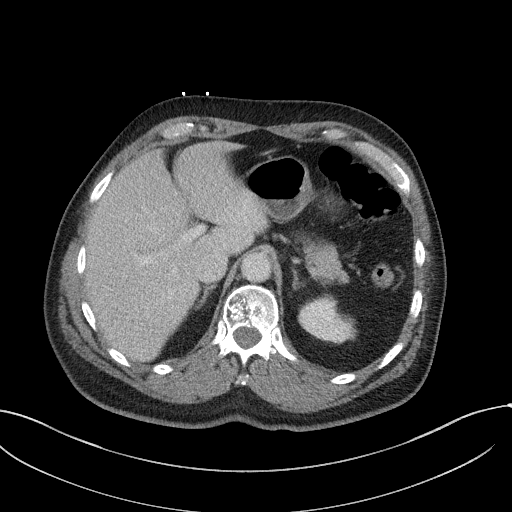
[im 84/99  soft-tissue]
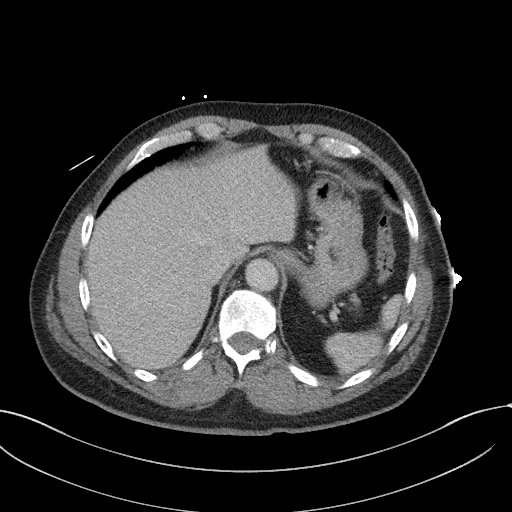
[im 94/99  soft-tissue]
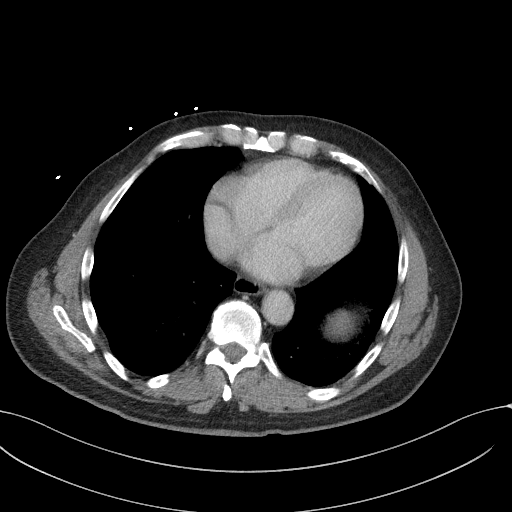

[Series 7: a/p w/ cor · coronal · 0.77mm/px · 3 of 150 slices shown]
[im 50/150  soft-tissue]
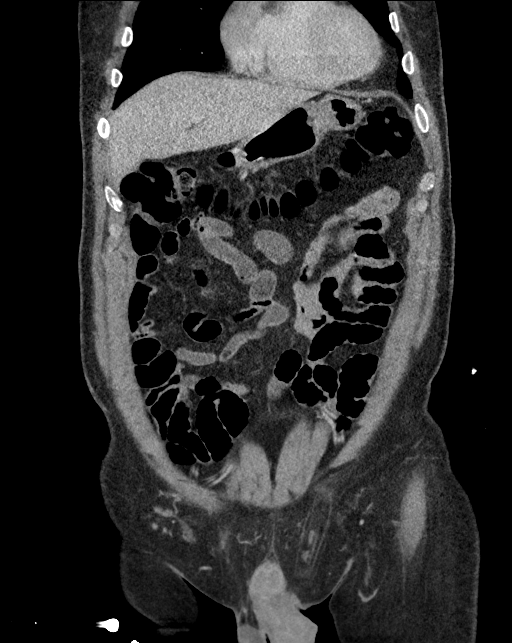
[im 67/150  soft-tissue]
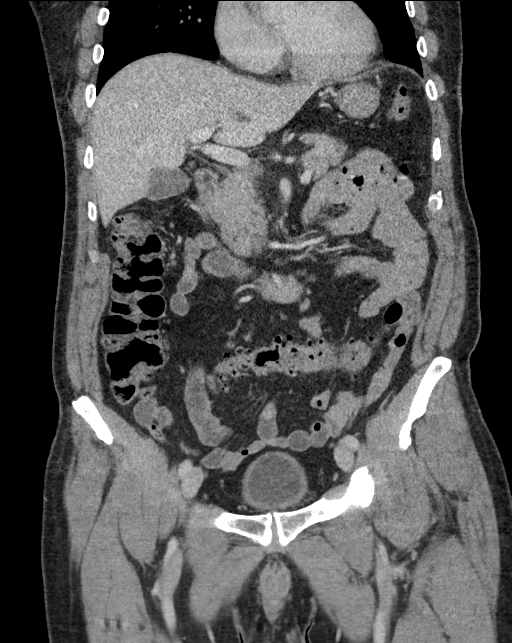
[im 83/150  soft-tissue]
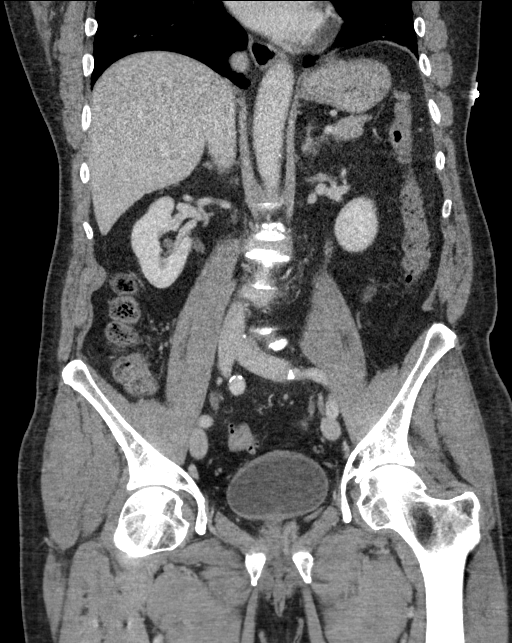

[16 of 46 positions shown; findings below may reference images not displayed]

FINDINGS: Lower chest: No acute pleural or parenchymal lung disease.

Hepatobiliary: No focal liver abnormality is seen. No gallstones,
gallbladder wall thickening, or biliary dilatation.

Pancreas: Unremarkable. No pancreatic ductal dilatation or
surrounding inflammatory changes.

Spleen: Normal in size without focal abnormality.

Adrenals/Urinary Tract: Adrenal glands are unremarkable. Kidneys are
normal, without renal calculi, focal lesion, or hydronephrosis.
Bladder is unremarkable.

Stomach/Bowel: No bowel obstruction or ileus. Normal appendix right
lower quadrant. No bowel wall thickening or inflammatory change.
Scattered sigmoid diverticulosis without diverticulitis.

Vascular/Lymphatic: Aortic atherosclerosis. No enlarged abdominal or
pelvic lymph nodes.

Reproductive: Prostate is unremarkable.

Other: No free fluid or free gas.  No abdominal wall hernia.

Musculoskeletal: No acute or destructive bony lesions. Reconstructed
images demonstrate no additional findings.
IMPRESSION: 1. Sigmoid diverticulosis without diverticulitis.
2. No acute intra-abdominal or intrapelvic process.
3.  Aortic Atherosclerosis ([id number]).

## 2022-01-31 ENCOUNTER — Other Ambulatory Visit: Payer: Self-pay | Admitting: Family Medicine

## 2022-01-31 ENCOUNTER — Other Ambulatory Visit (HOSPITAL_COMMUNITY): Payer: Self-pay | Admitting: Family Medicine

## 2022-01-31 DIAGNOSIS — E7800 Pure hypercholesterolemia, unspecified: Secondary | ICD-10-CM

## 2022-02-06 ENCOUNTER — Ambulatory Visit (HOSPITAL_COMMUNITY)
Admission: RE | Admit: 2022-02-06 | Discharge: 2022-02-06 | Disposition: A | Discharge status: Home / Self Care | Payer: Medicare Other | Source: Ambulatory Visit | Attending: Family Medicine | Admitting: Family Medicine

## 2022-02-06 DIAGNOSIS — E7800 Pure hypercholesterolemia, unspecified: Secondary | ICD-10-CM | POA: Insufficient documentation

## 2023-01-01 ENCOUNTER — Ambulatory Visit: Payer: Medicare Other | Attending: Cardiovascular Disease | Admitting: Cardiovascular Disease

## 2023-01-01 ENCOUNTER — Ambulatory Visit (INDEPENDENT_AMBULATORY_CARE_PROVIDER_SITE_OTHER): Payer: Medicare Other

## 2023-01-01 ENCOUNTER — Encounter: Payer: Self-pay | Admitting: Cardiovascular Disease

## 2023-01-01 VITALS — BP 118/76 | HR 60 | Ht 70.0 in | Wt 182.4 lb

## 2023-01-01 DIAGNOSIS — I10 Essential (primary) hypertension: Secondary | ICD-10-CM | POA: Diagnosis not present

## 2023-01-01 DIAGNOSIS — R079 Chest pain, unspecified: Secondary | ICD-10-CM | POA: Diagnosis not present

## 2023-01-01 DIAGNOSIS — I4819 Other persistent atrial fibrillation: Secondary | ICD-10-CM

## 2023-01-01 DIAGNOSIS — I700 Atherosclerosis of aorta: Secondary | ICD-10-CM

## 2023-01-01 DIAGNOSIS — N529 Male erectile dysfunction, unspecified: Secondary | ICD-10-CM

## 2023-01-01 DIAGNOSIS — E7800 Pure hypercholesterolemia, unspecified: Secondary | ICD-10-CM

## 2023-01-01 NOTE — Progress Notes (Unsigned)
Enrolled for [employer] to mail a ZIO XT long term holter monitor to the patients address on file.  

## 2023-01-01 NOTE — Patient Instructions (Addendum)
Medication Instructions:  No changes *If you need a refill on your cardiac medications before your next appointment, please call your pharmacy*  Testing/Procedures: Your physician has requested that you have an echocardiogram. Echocardiography is a painless test that uses sound waves to create images of your heart. It provides your doctor with information about the size and shape of your heart and how well your heart's chambers and valves are working. This procedure takes approximately one hour. There are no restrictions for this procedure. Please do NOT wear cologne, perfume, aftershave, or lotions (deodorant is allowed). Please arrive 15 minutes prior to your appointment time.    Your physician has recommended that you wear a 7 DAY ZIO-PATCH monitor. The Zio patch cardiac monitor continuously records heart rhythm data for up to 14 days, this is for patients being evaluated for multiple types heart rhythms. For the first 24 hours post application, please avoid getting the Zio monitor wet in the shower or by excessive sweating during exercise. After that, feel free to carry on with regular activities. Keep soaps and lotions away from the ZIO XT Patch.  This will be mailed to you, please expect 7-10 days to receive.    Applying the monitor   Shave hair from upper left chest.   Hold abrader disc by orange tab.  Rub abrader in 40 strokes over left upper chest as indicated in your monitor instructions.   Clean area with 4 enclosed alcohol pads .  Use all pads to assure are is cleaned thoroughly.  Let dry.   Apply patch as indicated in monitor instructions.  Patch will be place under collarbone on left side of chest with arrow pointing upward.   Rub patch adhesive wings for 2 minutes.Remove white label marked "1".  Remove white label marked "2".  Rub patch adhesive wings for 2 additional minutes.   While looking in a mirror, press and release button in center of patch.  A small green light will  flash 3-4 times .  This will be your only indicator the monitor has been turned on.     Do not shower for the first 24 hours.  You may shower after the first 24 hours.   Press button if you feel a symptom. You will hear a small click.  Record Date, Time and Symptom in the Patient Log Book.   When you are ready to remove patch, follow instructions on last 2 pages of Patient Log Book.  Stick patch monitor onto last page of Patient Log Book.   Place Patient Log Book in [place] box.  Use locking tab on box and tape box closed securely.  The [employer] and [employer] has [employer] on it.  Please place in mailbox as soon as possible.  Your physician should have your test results approximately 7 days after the monitor has been mailed back to [facility].   Call [facility] Customer Care at [phone] if you have questions regarding your ZIO XT patch monitor.  Call them immediately if you see an orange light blinking on your monitor.   If your monitor falls off in less than 4 days contact our Monitor department at [phone].  If your monitor becomes loose or falls off after 4 days call [employer] at [phone] for suggestions on securing your monitor    Follow-Up: At [facility], you and your health needs are our priority.  As part of our continuing mission to provide you with exceptional heart care, we have created designated  Provider Care Teams.  These Care Teams include your primary Cardiologist (physician) and Advanced Practice Providers (APPs -  Physician Assistants and Nurse Practitioners) who all work together to provide you with the care you need, when you need it.  We recommend signing up for the patient portal called "MyChart".  Sign up information is provided on this After Visit Summary.  MyChart is used to connect with patients for Virtual Visits (Telemedicine).  Patients are able to view lab/test results, encounter notes, upcoming appointments, etc.  Non-urgent messages  can be sent to your provider as well.   To learn more about what you can do with MyChart, go to [email or web address].    Your next appointment:    AFIB clinic in 2 weeks  See Dr Royann Shivers in 4 months

## 2023-01-01 NOTE — Progress Notes (Unsigned)
Cardiology Office Note:  .   Date:  01/02/2023  ID:  LETCHER SCHWEIKERT, DOB [birth year], MRN [id number] PCP: [facility]  [facility] Providers Cardiologist:  None    History of Present Illness: .   Eddie Dyer is a 72 y.o. male who is being seen today for the evaluation of atrial fibrillation at the request of Koirala, Dibas, MD.   Recently has a history of systemic hypertension and hypercholesterolemia as well as evidence of moderate coronary atherosclerosis based on a calcium score performed in 2023 (agate stent score 158, 47th percentile).  He has not had clinically evident coronary artery disease or peripheral arterial disease.  He was incidentally found to have an irregular rhythm during a physical examination 10/16/2022.  He was not aware of palpitations.  A previous electrocardiogram performed in 10/16/2020 showed normal sinus rhythm.  He was started on anticoagulation with Eliquis and was given a prescription for metoprolol succinate 25 mg daily.  Resting heart rate on presentation was 91 bpm before starting metoprolol.  He is unaware of palpitations.  For quite a while now he has been complaining of "indigestion" for which she has received treatment with PPI.  Important to note that the indigestion is most prominent when he is walking uphill and improves when he stops to rest.  He does not have chest discomfort at rest.  He denies shortness of breath with usual activity and does not have orthopnea or PND, but he does develop shortness of breath when the indigestion occurs.  He does not have claudication and has never had a stroke or TIA.  He has not had any problems with bleeding while on Eliquis anticoagulation.  He remains unaware of the irregular rhythm although he is still in atrial fibrillation today with a controlled rate of 60 bpm.  His most recent lipid profile from 10/16/2022 shows an LDL cholesterol of 110 and HDL of 63.  He does not have diabetes  mellitus.  He has normal renal function with a creatinine of 1.13.  He had a normal TSH 10/16/2022.  ROS: He has some problems with allergic rhinitis and postnasal drip.  Has mild erectile dysfunction (difficulty maintaining erection) sometimes needs to take sildenafil).  Studies Reviewed: Marland Kitchen   EKG Interpretation Date/Time:  Monday January 01 2023 11:10:09 EDT Ventricular Rate:  60 PR Interval:    QRS Duration:  80 QT Interval:  410 QTC Calculation: 410 R Axis:   59  Text Interpretation: Atrial fibrillation When compared with ECG of 16-Oct-2020 19:11, Atrial fibrillation has replaced Sinus rhythm Nonspecific T wave abnormality now evident in Inferior leads Confirmed by Elgie Landino [phone]) on 01/01/2023 1:01:07 PM    Coronary calcium score 02/06/2022 Calcium score 158, 47 percentile  CT abdomen and pelvis 10/16/2020 shows aortic atherosclerosis  Risk Assessment/Calculations:    CHA2DS2-VASc Score = 2   This indicates a 2.2% annual risk of stroke. The patient's score is based upon: CHF History: 0 HTN History: 1 Diabetes History: 0 Stroke History: 0 Vascular Disease History: 0 Age Score: 1 Gender Score: 0            Physical Exam:   VS:  BP 118/76   Pulse 60   Ht 5\' 10"  (1.778 m)   Wt 182 lb 6.4 oz (82.7 kg)   SpO2 96%   BMI 26.17 kg/m    Wt Readings from Last 3 Encounters:  01/01/23 182 lb 6.4 oz (82.7 kg)  10/16/20 178 lb (80.7  kg)  09/06/16 180 lb (81.6 kg)    GEN: Well nourished, well developed in no acute distress NECK: No JVD; No carotid bruits CARDIAC: Irregular, normal S1-S2, no murmurs, rubs, gallops.  2+ pulses in the radials, ulnar wrist, dorsalis pedis and posterior tibials bilaterally.  There are no audible carotid bruits or femoral bruits. RESPIRATORY:  Clear to auscultation without rales, wheezing or rhonchi  ABDOMEN: Soft, non-tender, non-distended EXTREMITIES:  No edema; No deformity   ASSESSMENT AND PLAN: .    1. Persistent atrial fibrillation  (HCC)   2. Exertional chest pain   3. Essential hypertension   4. Erectile dysfunction, unspecified erectile dysfunction type   5. Aortic atherosclerosis (HCC)   6. Hypercholesterolemia      A-fib: It is unclear whether this is paroxysmal or persistent, but it is quite possible that he has been in uninterrupted atrial fibrillation at least since April of this year.  Will have him wear a monitor to see whether arrhythmia is paroxysmal and to see if he has good ventricular rate control during physical activity.  The fact that he had a resting heart rate of only 91 when he initially presented with atrial fibrillation and the fact that a very low-dose of metoprolol succinate has reduces ventricular to 60 bpm suggest that he may have some underlying age-related AV conduction abnormalities.  Check echocardiogram to look for structural heart abnormalities.  If he has persistent atrial fibrillation, we should schedule him for cardioversion.  It would be interesting to see if his symptoms of exertional chest discomfort improved once he was in normal rhythm.  Reestablish normal rhythm will allow [place] to get better coronary evaluation.  We discussed the pros and cons of cardioversion, risks and benefits in detail today.  He is agreeable to proceed and understands that he needs 3 weeks of uninterrupted anticoagulation therapy for [place] to safely perform the cardioversion.  Will bring him back to A-fib clinic in a couple of weeks.  By that time we should have the results of the monitor.  If it shows persistent atrial fibrillation, they can schedule the cardioversion. Exertional chest discomfort: He has been calling his indigestion but there appears to be a consistent association with physical exertion.  His coronary calcium score is average for his age.  Ideally will evaluate him with a coronary CT angiogram, but we need to get him back in regular rhythm before we can obtain this.  I think it would be more useful than a  nuclear perfusion study.  If the discomfort that he is describing does indeed represent angina, the pattern is clear of stable angina and there is no need for urgent invasive evaluation.  He is on Eliquis I will not add antiplatelet therapy at this time. HTN: Very well-controlled. ED: on physical exam he has excellent lower extremity pulses. Aortic atherosclerosis: Incidentally noted on CT abdomen and pelvis from 2022, normal caliber aorta. HLP: Regardless of findings on further workup, it's probably wise to intensify lipid-lowering therapy to achieve target LDL less than 70.  Will wait for the results of his workup before changing medications.     Informed Consent   Shared Decision Making/Informed Consent The risks (stroke, cardiac arrhythmias rarely resulting in the need for a temporary or permanent pacemaker, skin irritation or burns and complications associated with conscious sedation including aspiration, arrhythmia, respiratory failure and death), benefits (restoration of normal sinus rhythm) and alternatives of a direct current cardioversion were explained in detail to Mr. Vernon and he  agrees to proceed.         Signed, Thurmon Fair, MD

## 2023-01-03 DIAGNOSIS — I4819 Other persistent atrial fibrillation: Secondary | ICD-10-CM | POA: Diagnosis not present

## 2023-01-18 ENCOUNTER — Telehealth: Payer: Self-pay | Admitting: Emergency Medicine

## 2023-01-18 DIAGNOSIS — I4819 Other persistent atrial fibrillation: Secondary | ICD-10-CM

## 2023-01-18 DIAGNOSIS — Z01812 Encounter for preprocedural laboratory examination: Secondary | ICD-10-CM

## 2023-01-18 NOTE — Telephone Encounter (Signed)
Called and went over the information below with the patient. He is unable to get lab work until 8/5- will put in STAT per Dr Royann Shivers. He and his wife verbalized understanding.   Croitoru, Rachelle Hora, MD  Scheryl Marten, RN Discussed findings with Mr. Nedra Hai.  The study shows that he is in well rate-controlled persistent atrial fibrillation.  Discussed elective cardioversion with sedation.  He is agreeable to going ahead with this procedure.  Please schedule first available, ideally before August 12 when he is having his echocardiogram.  Told me he would prefer Monday or Tuesday, but if there is a different date before August 12, please take it.     Dear Fernand Parkins  You are scheduled for a Cardioversion on Tuesday, August 6 with Dr. Mayford Knife.  Please arrive at the [facility] (Main Entrance A) at [facility]: [address] [place], [place] [address] at 7:30 AM (This time is 1 hour(s) before your procedure to ensure your preparation). Free valet parking service is available. You will check in at ADMITTING. The support person will be asked to wait in the waiting room.  It is OK to have someone drop you off and come back when you are ready to be discharged.      DIET:  Nothing to eat or drink after midnight except a sip of water with medications (see medication instructions below)  MEDICATION INSTRUCTIONS: !!IF ANY NEW MEDICATIONS ARE STARTED AFTER TODAY, PLEASE NOTIFY YOUR PROVIDER AS SOON AS POSSIBLE!!  FYI: Medications such as Semaglutide (Ozempic, [place]), Tirzepatide (Mounjaro, Zepbound), Dulaglutide (Trulicity), etc ("GLP1 agonists") AND Canagliflozin (Invokana), Dapagliflozin (Farxiga), Empagliflozin (Jardiance), Ertugliflozin (Steglatro), Bexagliflozin [employer]) or any combination with one of these drugs such as Invokamet (Canagliflozin/Metformin), Synjardy (Empagliflozin/Metformin), etc ("SGLT2 inhibitors") must be held around the time of a procedure. This is not a comprehensive list of  all of these drugs. Please review all of your medications and talk to your provider if you take any one of these. If you are not sure, ask your provider.  Continue taking your anticoagulant (blood thinner): Apixaban (Eliquis).  You will need to continue this after your procedure until you are told by your provider that it is safe to stop.    LABS: Pt unable to get lab work until 01/22/23- will send STAT per Dr Royann Shivers   FYI:  For your safety, and to allow [place] to monitor your vital signs accurately during the surgery/procedure we request: If you have artificial nails, gel coating, SNS etc, please have those removed prior to your surgery/procedure. Not having the nail coverings /polish removed may result in cancellation or delay of your surgery/procedure.  You must have a responsible person to drive you home and stay in the waiting area during your procedure. Failure to do so could result in cancellation.  Bring your insurance cards.  *Special Note: Every effort is made to have your procedure done on time. Occasionally there are emergencies that occur at the hospital that may cause delays. Please be patient if a delay does occur.

## 2023-01-22 ENCOUNTER — Encounter (HOSPITAL_COMMUNITY): Payer: Self-pay | Admitting: Internal Medicine

## 2023-01-22 ENCOUNTER — Other Ambulatory Visit: Payer: Self-pay

## 2023-01-22 ENCOUNTER — Ambulatory Visit (HOSPITAL_COMMUNITY)
Admission: RE | Admit: 2023-01-22 | Discharge: 2023-01-22 | Disposition: A | Discharge status: Home / Self Care | Payer: Medicare Other | Source: Ambulatory Visit | Attending: Internal Medicine | Admitting: Internal Medicine

## 2023-01-22 VITALS — BP 130/84 | HR 56 | Ht 70.0 in | Wt 176.8 lb

## 2023-01-22 DIAGNOSIS — Z7901 Long term (current) use of anticoagulants: Secondary | ICD-10-CM | POA: Diagnosis not present

## 2023-01-22 DIAGNOSIS — I4819 Other persistent atrial fibrillation: Secondary | ICD-10-CM | POA: Diagnosis not present

## 2023-01-22 DIAGNOSIS — I10 Essential (primary) hypertension: Secondary | ICD-10-CM | POA: Insufficient documentation

## 2023-01-22 DIAGNOSIS — R9431 Abnormal electrocardiogram [ECG] [EKG]: Secondary | ICD-10-CM | POA: Insufficient documentation

## 2023-01-22 DIAGNOSIS — I2510 Atherosclerotic heart disease of native coronary artery without angina pectoris: Secondary | ICD-10-CM | POA: Diagnosis not present

## 2023-01-22 DIAGNOSIS — E785 Hyperlipidemia, unspecified: Secondary | ICD-10-CM | POA: Diagnosis not present

## 2023-01-22 DIAGNOSIS — Z01812 Encounter for preprocedural laboratory examination: Secondary | ICD-10-CM

## 2023-01-22 DIAGNOSIS — D6859 Other primary thrombophilia: Secondary | ICD-10-CM | POA: Insufficient documentation

## 2023-01-22 DIAGNOSIS — D6869 Other thrombophilia: Secondary | ICD-10-CM | POA: Diagnosis not present

## 2023-01-22 NOTE — Progress Notes (Signed)
Primary Care Physician: [facility] Primary Cardiologist: None Electrophysiologist: None     Referring Physician: Dr. Wandalee Ferdinand is a 72 y.o. male with a history of HTN, HLD, CAD, and persistent atrial fibrillation who presents for consultation in the [facility]. Seen by Dr. Royann Shivers on 01/01/23 and found to be in Afib. Cardiac monitor worn which confirmed persistent Afib and scheduled for DCCV tomorrow, 01/23/23. Patient is on Eliquis for a CHADS2VASC score of 3.  On evaluation today, he is currently in rate controlled Afib. He is scheduled for DCCV tomorrow, 01/23/23. He has not missed any doses of Eliquis. He had labs done earlier today for DCCV. He does drink some coffee daily. He does snore per wife.   Today, he denies symptoms of palpitations, chest pain, shortness of breath, orthopnea, PND, lower extremity edema, dizziness, presyncope, syncope, snoring, daytime somnolence, bleeding, or neurologic sequela. The patient is tolerating medications without difficulties and is otherwise without complaint today.    Atrial Fibrillation Risk Factors:  he does have symptoms or diagnosis of sleep apnea.  he has a BMI of Body mass index is 25.37 kg/m.Marland Kitchen Filed Weights   01/22/23 1019  Weight: 80.2 kg    Current Outpatient Medications  Medication Sig Dispense Refill   albuterol (VENTOLIN HFA) 108 (90 Base) MCG/ACT inhaler Inhale 1-2 puffs into the lungs every 6 (six) hours as needed for shortness of breath.     apixaban (ELIQUIS) 5 MG TABS tablet Take 5 mg by mouth 2 (two) times daily.     atorvastatin (LIPITOR) 10 MG tablet Take 10 mg by mouth daily.     enalapril (VASOTEC) 10 MG tablet Take 20 mg by mouth daily.      ipratropium (ATROVENT) 0.06 % nasal spray Place 2 sprays into both nostrils daily as needed for rhinitis.     metoprolol succinate (TOPROL-XL) 25 MG 24 hr tablet Take 25 mg by mouth daily.     Multiple  Vitamins-Minerals (MULTIVITAMIN WITH MINERALS) tablet Take 1 tablet by mouth daily.     omeprazole (PRILOSEC) 40 MG capsule Take 40 mg by mouth daily.     omeprazole (PRILOSEC) 20 MG capsule Take 1 capsule (20 mg total) by mouth daily. (Patient not taking: Reported on 01/22/2023) 30 capsule 0   tamsulosin (FLOMAX) 0.4 MG CAPS capsule Take 0.4 mg by mouth daily. (Patient not taking: Reported on 01/22/2023)     No current facility-administered medications for this encounter.    Atrial Fibrillation Management history:  Previous antiarrhythmic drugs: None Previous cardioversions: Scheduled for 01/23/23 Previous ablations: None Anticoagulation history: Eliquis  ROS- All systems are reviewed and negative except as per the HPI above.  Physical Exam: BP 130/84   Pulse (!) 56   Ht 5\' 10"  (1.778 m)   Wt 80.2 kg   BMI 25.37 kg/m   GEN: Well nourished, well developed in no acute distress NECK: No JVD; No carotid bruits CARDIAC: Irregularly irregular rate and rhythm, no murmurs, rubs, gallops RESPIRATORY:  Clear to auscultation without rales, wheezing or rhonchi  ABDOMEN: Soft, non-tender, non-distended EXTREMITIES:  No edema; No deformity   EKG today demonstrates  Vent. rate 56 BPM PR interval * ms QRS duration 92 ms QT/QTcB 390/376 ms P-R-T axes * 59 7 Atrial fibrillation with slow ventricular response Abnormal ECG When compared with ECG of 01-Jan-2023 11:10, PREVIOUS ECG IS PRESENT  Echo scheduled for 8/12.  Cardiac monitor 12/2022: Patch  Wear Time:  6 days and 23 hours (2024-07-17T21:06:23-0400 to 2024-07-24T20:46:29-0400)   Atrial Fibrillation occurred continuously (100% burden), ranging from 34-122 bpm (avg of 63 bpm). 45 Pauses occurred, the longest lasting 3.9 secs (15 bpm). Isolated VEs were rare (<1.0%, 183), VE Couplets were rare (<1.0%, 9), and VE Triplets were rare  (<1.0%, 1).   ASSESSMENT & PLAN CHA2DS2-VASc Score = 2  The patient's score is based upon: CHF History:  0 HTN History: 1 Diabetes History: 0 Stroke History: 0 Vascular Disease History: 0 Age Score: 1 Gender Score: 0      ASSESSMENT AND PLAN: Persistent Atrial Fibrillation (ICD10:  I48.19) The patient's CHA2DS2-VASc score is 2, indicating a 2.2% annual risk of stroke.    He is currently in Afib and scheduled for DCCV tomorrow, 01/23/23.  Informed Consent   Shared Decision Making/Informed Consent The risks (stroke, cardiac arrhythmias rarely resulting in the need for a temporary or permanent pacemaker, skin irritation or burns and complications associated with conscious sedation including aspiration, arrhythmia, respiratory failure and death), benefits (restoration of normal sinus rhythm) and alternatives of a direct current cardioversion were explained in detail to Mr. Verhagen and he agrees to proceed.   We discussed rhythm monitoring device for home use. We discussed reducing caffeine and alcohol intake. We discussed sleep study and he would like to be referred for that. We discussed AAD therapy and ablation going forward as indicated.   Secondary Hypercoagulable State (ICD10:  D68.69) The patient is at significant risk for stroke/thromboembolism based upon his CHA2DS2-VASc Score of 2.  Continue Apixaban (Eliquis).  No missed doses.    Follow up 1-2 weeks after DCCV.   Lake Bells, PA-C  Afib Clinic [facility] [address] [place], [place] [address] [phone]

## 2023-01-22 NOTE — H&P (View-Only) (Signed)
Primary Care Physician: [facility] Primary Cardiologist: None Electrophysiologist: None     Referring Physician: Dr. [name] is a 72 y.o. male with a history of HTN, HLD, CAD, and persistent atrial fibrillation who presents for consultation in the [facility]. Seen by Dr. Royann Dyer on 01/01/23 and found to be in Afib. Cardiac monitor worn which confirmed persistent Afib and scheduled for DCCV tomorrow, 01/23/23. Patient is on Eliquis for a CHADS2VASC score of 3.  On evaluation today, he is currently in rate controlled Afib. He is scheduled for DCCV tomorrow, 01/23/23. He has not missed any doses of Eliquis. He had labs done earlier today for DCCV. He does drink some coffee daily. He does snore per wife.   Today, he denies symptoms of palpitations, chest pain, shortness of breath, orthopnea, PND, lower extremity edema, dizziness, presyncope, syncope, snoring, daytime somnolence, bleeding, or neurologic sequela. The patient is tolerating medications without difficulties and is otherwise without complaint today.    Atrial Fibrillation Risk Factors:  he does have symptoms or diagnosis of sleep apnea.  he has a BMI of Body mass index is 25.37 kg/m.Marland Kitchen Filed Weights   01/22/23 1019  Weight: 80.2 kg    Current Outpatient Medications  Medication Sig Dispense Refill   albuterol (VENTOLIN HFA) 108 (90 Base) MCG/ACT inhaler Inhale 1-2 puffs into the lungs every 6 (six) hours as needed for shortness of breath.     apixaban (ELIQUIS) 5 MG TABS tablet Take 5 mg by mouth 2 (two) times daily.     atorvastatin (LIPITOR) 10 MG tablet Take 10 mg by mouth daily.     enalapril (VASOTEC) 10 MG tablet Take 20 mg by mouth daily.      ipratropium (ATROVENT) 0.06 % nasal spray Place 2 sprays into both nostrils daily as needed for rhinitis.     metoprolol succinate (TOPROL-XL) 25 MG 24 hr tablet Take 25 mg by mouth daily.     Multiple  Vitamins-Minerals (MULTIVITAMIN WITH MINERALS) tablet Take 1 tablet by mouth daily.     omeprazole (PRILOSEC) 40 MG capsule Take 40 mg by mouth daily.     omeprazole (PRILOSEC) 20 MG capsule Take 1 capsule (20 mg total) by mouth daily. (Patient not taking: Reported on 01/22/2023) 30 capsule 0   tamsulosin (FLOMAX) 0.4 MG CAPS capsule Take 0.4 mg by mouth daily. (Patient not taking: Reported on 01/22/2023)     No current facility-administered medications for this encounter.    Atrial Fibrillation Management history:  Previous antiarrhythmic drugs: None Previous cardioversions: Scheduled for 01/23/23 Previous ablations: None Anticoagulation history: Eliquis  ROS- All systems are reviewed and negative except as per the HPI above.  Physical Exam: BP 130/84   Pulse (!) 56   Ht 5\' 10"  (1.778 m)   Wt 80.2 kg   BMI 25.37 kg/m   GEN: Well nourished, well developed in no acute distress NECK: No JVD; No carotid bruits CARDIAC: Irregularly irregular rate and rhythm, no murmurs, rubs, gallops RESPIRATORY:  Clear to auscultation without rales, wheezing or rhonchi  ABDOMEN: Soft, non-tender, non-distended EXTREMITIES:  No edema; No deformity   EKG today demonstrates  Vent. rate 56 BPM PR interval * ms QRS duration 92 ms QT/QTcB 390/376 ms P-R-T axes * 59 7 Atrial fibrillation with slow ventricular response Abnormal ECG When compared with ECG of 01-Jan-2023 11:10, PREVIOUS ECG IS PRESENT  Echo scheduled for 8/12.  Cardiac monitor 12/2022: Patch  Wear Time:  6 days and 23 hours (2024-07-17T21:06:23-0400 to 2024-07-24T20:46:29-0400)   Atrial Fibrillation occurred continuously (100% burden), ranging from 34-122 bpm (avg of 63 bpm). 45 Pauses occurred, the longest lasting 3.9 secs (15 bpm). Isolated VEs were rare (<1.0%, 183), VE Couplets were rare (<1.0%, 9), and VE Triplets were rare  (<1.0%, 1).   ASSESSMENT & PLAN CHA2DS2-VASc Score = 2  The patient's score is based upon: CHF History:  0 HTN History: 1 Diabetes History: 0 Stroke History: 0 Vascular Disease History: 0 Age Score: 1 Gender Score: 0      ASSESSMENT AND PLAN: Persistent Atrial Fibrillation (ICD10:  I48.19) The patient's CHA2DS2-VASc score is 2, indicating a 2.2% annual risk of stroke.    He is currently in Afib and scheduled for DCCV tomorrow, 01/23/23.  Informed Consent   Shared Decision Making/Informed Consent The risks (stroke, cardiac arrhythmias rarely resulting in the need for a temporary or permanent pacemaker, skin irritation or burns and complications associated with conscious sedation including aspiration, arrhythmia, respiratory failure and death), benefits (restoration of normal sinus rhythm) and alternatives of a direct current cardioversion were explained in detail to Eddie Dyer and he agrees to proceed.   We discussed rhythm monitoring device for home use. We discussed reducing caffeine and alcohol intake. We discussed sleep study and he would like to be referred for that. We discussed AAD therapy and ablation going forward as indicated.   Secondary Hypercoagulable State (ICD10:  D68.69) The patient is at significant risk for stroke/thromboembolism based upon his CHA2DS2-VASc Score of 2.  Continue Apixaban (Eliquis).  No missed doses.    Follow up 1-2 weeks after DCCV.   Eddie Bells, PA-C  [facility] [address] [place], [place] [address] [phone]

## 2023-01-22 NOTE — Pre-Procedure Instructions (Signed)
Spoke to patient on phone regarding procedure tomorrow.  Instructed to arrive at 630 am, NPO after midnight.  Confirmed patient has a ride home and responsible person to stay with patient for 24 hours after the procedure  Patient missed one dose on Saturday (01/20/23), instructed patient to take the morning of surgery with a sip of water.  Victorino Dike notified of missed dose.  She will contact Dr. Mayford Knife.    Take BP meds in the AM with a sip of water.

## 2023-01-23 ENCOUNTER — Ambulatory Visit (HOSPITAL_COMMUNITY): Payer: Medicare Other | Admitting: Anesthesiology

## 2023-01-23 ENCOUNTER — Ambulatory Visit (HOSPITAL_COMMUNITY)
Admission: RE | Admit: 2023-01-23 | Discharge: 2023-01-23 | Disposition: A | Discharge status: Home / Self Care | Payer: Medicare Other | Attending: Cardiology | Admitting: Cardiology

## 2023-01-23 ENCOUNTER — Encounter (HOSPITAL_COMMUNITY)
Admission: RE | Disposition: A | Discharge status: Home / Self Care | Payer: Self-pay | Source: Home / Self Care | Attending: Cardiology

## 2023-01-23 ENCOUNTER — Other Ambulatory Visit: Payer: Self-pay

## 2023-01-23 ENCOUNTER — Ambulatory Visit (HOSPITAL_BASED_OUTPATIENT_CLINIC_OR_DEPARTMENT_OTHER): Payer: Medicare Other | Admitting: Anesthesiology

## 2023-01-23 ENCOUNTER — Encounter (HOSPITAL_COMMUNITY): Payer: Self-pay | Admitting: Cardiology

## 2023-01-23 ENCOUNTER — Ambulatory Visit (HOSPITAL_COMMUNITY): Payer: Medicare Other

## 2023-01-23 DIAGNOSIS — I10 Essential (primary) hypertension: Secondary | ICD-10-CM | POA: Insufficient documentation

## 2023-01-23 DIAGNOSIS — Z7901 Long term (current) use of anticoagulants: Secondary | ICD-10-CM | POA: Diagnosis not present

## 2023-01-23 DIAGNOSIS — E785 Hyperlipidemia, unspecified: Secondary | ICD-10-CM | POA: Diagnosis not present

## 2023-01-23 DIAGNOSIS — I4819 Other persistent atrial fibrillation: Secondary | ICD-10-CM | POA: Insufficient documentation

## 2023-01-23 DIAGNOSIS — I340 Nonrheumatic mitral (valve) insufficiency: Secondary | ICD-10-CM

## 2023-01-23 DIAGNOSIS — I4891 Unspecified atrial fibrillation: Secondary | ICD-10-CM | POA: Diagnosis not present

## 2023-01-23 DIAGNOSIS — D6869 Other thrombophilia: Secondary | ICD-10-CM | POA: Diagnosis not present

## 2023-01-23 DIAGNOSIS — I2510 Atherosclerotic heart disease of native coronary artery without angina pectoris: Secondary | ICD-10-CM | POA: Insufficient documentation

## 2023-01-23 HISTORY — PX: TEE WITHOUT CARDIOVERSION: SHX5443

## 2023-01-23 HISTORY — PX: CARDIOVERSION: SHX1299

## 2023-01-23 LAB — ECHO TEE

## 2023-01-23 SURGERY — CARDIOVERSION
Anesthesia: Monitor Anesthesia Care

## 2023-01-23 MED ORDER — SODIUM CHLORIDE 0.9 % IV SOLN: INTRAVENOUS | Status: DC | PRN

## 2023-01-23 MED ORDER — LIDOCAINE 2% (20 MG/ML) 5 ML SYRINGE
INTRAMUSCULAR | Status: DC | PRN
  Administered 2023-01-23: 40 mg via INTRAVENOUS

## 2023-01-23 MED ORDER — PROPOFOL 500 MG/50ML IV EMUL
INTRAVENOUS | Status: DC | PRN
  Administered 2023-01-23: 125 ug/kg/min via INTRAVENOUS

## 2023-01-23 MED ORDER — SODIUM CHLORIDE 0.9 % IV SOLN: INTRAVENOUS | Status: DC

## 2023-01-23 MED ORDER — PROPOFOL 10 MG/ML IV BOLUS
INTRAVENOUS | Status: DC | PRN
Start: 2023-01-23 — End: 2023-01-23
  Administered 2023-01-23 (×2): 25 mg via INTRAVENOUS

## 2023-01-23 SURGICAL SUPPLY — 1 items: ELECT DEFIB PAD ADLT CADENCE (PAD) ×2 IMPLANT

## 2023-01-23 NOTE — Transfer of Care (Signed)
Immediate Anesthesia Transfer of Care Note  Patient: Eddie Dyer  Procedure(s) Performed: CARDIOVERSION TRANSESOPHAGEAL ECHOCARDIOGRAM  Patient Location: Cath Lab  Anesthesia Type:MAC  Level of Consciousness: awake, alert , and oriented  Airway & Oxygen Therapy: Patient Spontanous Breathing and Patient connected to nasal cannula oxygen  Post-op Assessment: Report given to RN and Post -op Vital signs reviewed and stable  Post vital signs: Reviewed and stable  Last Vitals:  Vitals Value Taken Time  BP 113/73 01/23/23 1004  Temp 36.1 C 01/23/23 1004  Pulse 50 01/23/23 1005  Resp 17 01/23/23 1005  SpO2 96 % 01/23/23 1005  Vitals shown include unfiled device data.  Last Pain:  Vitals:   01/23/23 1004  TempSrc: Temporal  PainSc: 0-No pain         Complications: No notable events documented.

## 2023-01-23 NOTE — Anesthesia Preprocedure Evaluation (Signed)
Anesthesia Evaluation  Patient identified by MRN, date of birth, ID band Patient awake    Reviewed: Allergy & Precautions, NPO status , Patient's Chart, lab work & pertinent test results  History of Anesthesia Complications Negative for: history of anesthetic complications  Airway Mallampati: III  TM Distance: >3 FB Neck ROM: Full    Dental  (+) Dental Advisory Given, Teeth Intact, Caps,    Pulmonary neg shortness of breath, neg sleep apnea, neg COPD, neg recent URI, former smoker   breath sounds clear to auscultation       Cardiovascular hypertension, Pt. on medications and Pt. on home beta blockers (-) angina (-) Past MI, (-) Cardiac Stents and (-) CABG + dysrhythmias Atrial Fibrillation  Rhythm:Irregular     Neuro/Psych neg Seizures negative neurological ROS  negative psych ROS   GI/Hepatic negative GI ROS, Neg liver ROS,,,  Endo/Other  negative endocrine ROS  No results found for: "HGBA1C"   Renal/GU Lab Results      Component                Value               Date                      NA                       141                 01/22/2023                K                        4.6                 01/22/2023                CO2                      25                  01/22/2023                GLUCOSE                  92                  01/22/2023                BUN                      13                  01/22/2023                CREATININE               1.22                01/22/2023                CALCIUM                  9.4                 01/22/2023                EGFR  63                  01/22/2023                GFRNONAA                 >60                 10/16/2020                Musculoskeletal negative musculoskeletal ROS (+)    Abdominal   Peds  Hematology  (+) Blood dyscrasia Lab Results      Component                Value               Date                      WBC                       6.1                 01/22/2023                HGB                      15.0                01/22/2023                HCT                      45.6                01/22/2023                MCV                      97                  01/22/2023                PLT                      205                 01/22/2023            eliquis   Anesthesia Other Findings   Reproductive/Obstetrics                             Anesthesia Physical Anesthesia Plan  ASA: 2  Anesthesia Plan: MAC and General   Post-op Pain Management: Minimal or no pain anticipated   Induction: Intravenous  PONV Risk Score and Plan: 2 and Propofol infusion and Treatment may vary due to age or medical condition  Airway Management Planned: Nasal Cannula, Natural Airway and Simple Face Mask  Additional Equipment: None  Intra-op Plan:   Post-operative Plan:   Informed Consent: I have reviewed the patients History and Physical, chart, labs and discussed the procedure including the risks, benefits and alternatives for the proposed anesthesia with the patient or authorized representative who has indicated his/her understanding and acceptance.     Dental advisory given  Plan Discussed with: CRNA  Anesthesia Plan Comments:  Anesthesia Quick Evaluation

## 2023-01-23 NOTE — Anesthesia Procedure Notes (Signed)
Procedure Name: MAC Date/Time: 01/23/2023 9:49 AM  Performed by: Randon Goldsmith, CRNAPre-anesthesia Checklist: Patient identified, Emergency Drugs available, Suction available and Patient being monitored Patient Re-evaluated:Patient Re-evaluated prior to induction Oxygen Delivery Method: Nasal cannula Preoxygenation: Pre-oxygenation with 100% oxygen

## 2023-01-23 NOTE — CV Procedure (Addendum)
     PROCEDURE NOTE:  Procedure:  Transesophageal echocardiogram Operator:  Armanda Magic, MD Indications:  atrial fibrillation Complications: None  During this procedure the patient is administered a total of Propofol 280 mg to achieve and maintain moderate conscious sedation.  The patient's heart rate, blood pressure, and oxygen saturation are monitored continuously during the procedure by anesthesia.   Results: Normal LV size and function Normal RV size and function Normal RA Normal LA and LA appendage with no evidence of thrombus Normal TV with mild TR Normal PV with trivial PR Normal MV with mild MR Normal trileaflet AV Normal interatrial septum with no evidence of shunt by colorflow dopper and agitated saline contrast injection Normal thoracic and ascending aorta.   Electrical Cardioversion Procedure Note QUASHAUN KLIMCZAK [id number] [birth year]  Procedure: Electrical Cardioversion Indications:  Atrial Fibrillation  Procedure Details  Cardioversion was done with synchronized biphasic defibrillation with AP pads with 150watts.  The patient converted to normal sinus rhythm. The patient tolerated the procedure well   IMPRESSION:  Successful cardioversion of atrial fibrillation    Eddie Dyer 01/23/2023, 8:45 AM

## 2023-01-23 NOTE — Discharge Instructions (Addendum)
Electrical Cardioversion Electrical cardioversion is the delivery of a jolt of electricity to restore a normal rhythm to the heart. A rhythm that is too fast or is not regular (arrhythmia) keeps the heart from pumping blood well. There is also another type of cardioversion called a chemical (pharmacologic) cardioversion. This is when your health care provider gives you one or more medicines to bring back your regular heart rhythm. Electrical cardioversion is done as a scheduled procedure for arrhythmiasthat are not life-threatening. Electrical cardioversion may also be done in an emergency for sudden life-threatening arrhythmias. Tell a health care provider about: Any allergies you have. All medicines you are taking, including vitamins, herbs, eye drops, creams, and over-the-counter medicines. Any problems you or family members have had with sedatives or anesthesia. Any bleeding problems you have. Any surgeries you have had, including a pacemaker, defibrillator, or other implanted device. Any medical conditions you have. Whether you are pregnant or may be pregnant. What are the risks? Your provider will talk with you about risks. These include: Allergic reactions to medicines. Irritation to the skin on your chest or back where the sticky pads (electrodes) or paddles were put during electrical cardioversion. A blood clot that breaks free and travels to other parts of your body, such as your brain. Return of a worse abnormal heart rhythm that will need to be treated with medicines, a pacemaker, or an implantable cardioverter defibrillator (ICD). What happens before the procedure? Medicines Your provider may give you: Blood-thinning medicines (anticoagulants) so your blood does not clot as easily. If your provider gives you this medicine, you may need to take it for 4 weeks before the procedure. Medicines to help stabilize your heart rate and rhythm. Ask your provider about: Changing or stopping  your regular medicines. These include any diabetes medicines or blood thinners you take. Taking medicines such as aspirin and ibuprofen. These medicines can thin your blood. Do not take them unless your provider tells you to. Taking over-the-counter medicines, vitamins, herbs, and supplements. General instructions Follow instructions from your provider about what you may eat and drink. Do not put any lotions, powders, or ointments on your chest and back for 24 hours before the procedure. They can cause problems with the electrodes or paddles used to deliver electricity to your heart. Do not wear jewelry as this can interfere with delivering electricity to your heart. If you will be going home right after the procedure, plan to have a responsible adult: Take you home from the hospital or clinic. You will not be allowed to drive. Care for you for the time you are told. Tests You may have an exam or testing. This may include: Blood labs. A transesophageal echocardiogram (TEE). What happens during the procedure?     An IV will be inserted into one of your veins. You will be given a sedative. This helps you relax. Electrodes or metal paddles will be placed on your chest. They may be placed in one of these ways: One placed on your right chest, the other on the left ribs. One placed on your chest and the other on your back. An electrical shock will be delivered. The shock briefly stops (resets) your heart rhythm. Your provider will check to see if your heart rhythm is now normal. Some people need only one shock. Some need more to restore a normal heart rhythm. The procedure may vary among providers and hospitals. What happens after the procedure? Your blood pressure, heart rate, breathing rate, and blood oxygen  level will be monitored until you leave the hospital or clinic. Your heart rhythm will be watched to make sure it does not change. This information is not intended to replace advice  given to you by your health care provider. Make sure you discuss any questions you have with your health care provider. Document Revised: 01/26/2022 Document Reviewed: 01/26/2022 Elsevier Patient Education  2024 [employer] Transesophageal Echocardiogram Transesophageal echocardiogram (TEE) is a test that uses sound waves to take pictures of your heart. TEE is done by passing a small probe attached to a flexible tube down the part of the body that moves food from your mouth to your stomach (esophagus). The pictures give clear images of your heart. This can help your doctor see if there are problems with your heart. Tell a doctor about: Any allergies you have. All medicines you are taking. This includes vitamins, herbs, eye drops, creams, and over-the-counter medicines. Any problems you or family members have had with anesthetic medicines. Any blood disorders you have. Any surgeries you have had. Any medical conditions you have. Any swallowing problems. Whether you have or have had a blockage in the part of the body that moves food from your mouth to your stomach. Whether you are pregnant or may be pregnant. What are the risks? In general, this is a safe procedure. But, problems may occur, such as: Damage to nearby structures or organs. A tear in the part of the body that moves food from your mouth to your stomach. Irregular heartbeat. Hoarse voice or trouble swallowing. Bleeding. What happens before the procedure? Medicines Ask your doctor about changing or stopping: Your normal medicines. Vitamins, herbs, and supplements. Over-the-counter medicines. Do not take aspirin or ibuprofen unless you are told to. General instructions Follow instructions from your doctor about what you cannot eat or drink. You will take out any dentures or dental retainers. Plan to have a responsible adult take you home from the hospital or clinic. Plan to have a responsible adult care for you for the  time you are told after you leave the hospital or clinic. This is important. What happens during the procedure?  An IV will be put into one of your veins. You may be given: A sedative. This medicine helps you relax. A medicine to numb the back of your throat. This may be sprayed or gargled. Your blood pressure, heart rate, and breathing will be watched. You may be asked to lie on your left side. A bite block will be placed in your mouth. This keeps you from biting the tube. The tip of the probe will be placed into the back of your mouth. You will be asked to swallow. Your doctor will take pictures of your heart. The probe and bite block will be taken out after the test is done. The procedure may vary among doctors and hospitals. What can I expect after the procedure? You will be monitored until you leave the hospital or clinic. This includes checking your blood pressure, heart rate, breathing rate, and blood oxygen level. Your throat may feel sore and numb. This will get better over time. You will not be allowed to eat or drink until the numbness has gone away. It is common to have a sore throat for a day or two. It is up to you to get the results of your procedure. Ask how to get your results when they are ready. Follow these instructions at home: If you were given a sedative during your procedure, do  not drive or use machines until your doctor says that it is safe. Return to your normal activities when your doctor says that it is safe. Keep all follow-up visits. Summary TEE is a test that uses sound waves to take pictures of your heart. You will be given a medicine to help you relax. Do not drive or use machines until your doctor says that it is safe. This information is not intended to replace advice given to you by your health care provider. Make sure you discuss any questions you have with your health care provider. Document Revised: 02/16/2021 Document Reviewed:  01/27/2020 Elsevier Patient Education  2024 [employer].

## 2023-01-23 NOTE — Interval H&P Note (Signed)
History and Physical Interval Note:  01/23/2023 9:07 AM  Eddie Dyer  has presented today for surgery, with the diagnosis of AFIB.  The various methods of treatment have been discussed with the patient and family. After consideration of risks, benefits and other options for treatment, the patient has consented to  Procedure(s): CARDIOVERSION (N/A) TRANSESOPHAGEAL ECHOCARDIOGRAM (N/A) as a surgical intervention.  The patient's history has been reviewed, patient examined, no change in status, stable for surgery.  I have reviewed the patient's chart and labs.  Questions were answered to the patient's satisfaction.     Armanda Magic

## 2023-01-23 NOTE — Anesthesia Postprocedure Evaluation (Signed)
Anesthesia Post Note  Patient: Eddie Dyer  Procedure(s) Performed: CARDIOVERSION TRANSESOPHAGEAL ECHOCARDIOGRAM     Patient location during evaluation: PACU Anesthesia Type: MAC Level of consciousness: awake and alert Pain management: pain level controlled Vital Signs Assessment: post-procedure vital signs reviewed and stable Respiratory status: spontaneous breathing, nonlabored ventilation and respiratory function stable Cardiovascular status: blood pressure returned to baseline Postop Assessment: no apparent nausea or vomiting Anesthetic complications: no   No notable events documented.  Last Vitals:  Vitals:   01/23/23 1025 01/23/23 1030  BP: 127/80 123/80  Pulse: (!) 50 (!) 50  Resp: 12 14  Temp:    SpO2: 99% 98%    Last Pain:  Vitals:   01/23/23 1025  TempSrc:   PainSc: 0-No pain                 Shanda Howells

## 2023-01-23 NOTE — Interval H&P Note (Signed)
History and Physical Interval Note:  01/23/2023 8:44 AM  Eddie Dyer  has presented today for surgery, with the diagnosis of AFIB.  The various methods of treatment have been discussed with the patient and family. After consideration of risks, benefits and other options for treatment, the patient has consented to  Procedure(s): CARDIOVERSION (N/A) TRANSESOPHAGEAL ECHOCARDIOGRAM (N/A) as a surgical intervention.  The patient's history has been reviewed, patient examined, no change in status, stable for surgery.  I have reviewed the patient's chart and labs.  Questions were answered to the patient's satisfaction.     Armanda Magic

## 2023-01-24 ENCOUNTER — Encounter (HOSPITAL_COMMUNITY): Payer: Self-pay | Admitting: Cardiology

## 2023-01-25 ENCOUNTER — Telehealth (HOSPITAL_COMMUNITY): Payer: Self-pay | Admitting: Cardiovascular Disease

## 2023-01-25 NOTE — Telephone Encounter (Signed)
Patient left a voicemail to cancel echo, Dr told him that he did not need. Order will be removed from the echo WQ. Thank you.

## 2023-01-29 ENCOUNTER — Ambulatory Visit (HOSPITAL_COMMUNITY): Payer: Medicare Other

## 2023-02-08 ENCOUNTER — Ambulatory Visit (HOSPITAL_COMMUNITY): Payer: Medicare Other | Admitting: Internal Medicine

## 2023-02-12 ENCOUNTER — Ambulatory Visit (HOSPITAL_COMMUNITY)
Admission: RE | Admit: 2023-02-12 | Discharge: 2023-02-12 | Disposition: A | Discharge status: Home / Self Care | Payer: Medicare Other | Source: Ambulatory Visit | Attending: Internal Medicine | Admitting: Internal Medicine

## 2023-02-12 VITALS — BP 154/86 | HR 51 | Ht 70.0 in | Wt 181.2 lb

## 2023-02-12 DIAGNOSIS — Z7901 Long term (current) use of anticoagulants: Secondary | ICD-10-CM | POA: Insufficient documentation

## 2023-02-12 DIAGNOSIS — I4819 Other persistent atrial fibrillation: Secondary | ICD-10-CM | POA: Diagnosis present

## 2023-02-12 DIAGNOSIS — I10 Essential (primary) hypertension: Secondary | ICD-10-CM | POA: Insufficient documentation

## 2023-02-12 DIAGNOSIS — D6869 Other thrombophilia: Secondary | ICD-10-CM | POA: Insufficient documentation

## 2023-02-12 DIAGNOSIS — I2510 Atherosclerotic heart disease of native coronary artery without angina pectoris: Secondary | ICD-10-CM | POA: Insufficient documentation

## 2023-02-12 DIAGNOSIS — E785 Hyperlipidemia, unspecified: Secondary | ICD-10-CM | POA: Insufficient documentation

## 2023-02-12 DIAGNOSIS — Z79899 Other long term (current) drug therapy: Secondary | ICD-10-CM | POA: Diagnosis not present

## 2023-02-12 NOTE — Progress Notes (Signed)
Primary Care Physician: [facility] Primary Cardiologist: None Electrophysiologist: None     Referring Physician: Dr. Wandalee Ferdinand is a 72 y.o. male with a history of HTN, HLD, CAD, and persistent atrial fibrillation who presents for consultation in the [facility]. Seen by Dr. Royann Shivers on 01/01/23 and found to be in Afib. Cardiac monitor worn which confirmed persistent Afib and scheduled for DCCV tomorrow, 01/23/23. Patient is on Eliquis for a CHADS2VASC score of 3.  On evaluation today, he is currently in rate controlled Afib. He is scheduled for DCCV tomorrow, 01/23/23. He has not missed any doses of Eliquis. He had labs done earlier today for DCCV. He does drink some coffee daily. He does snore per wife.   On follow up 02/12/23, he is currently in Afib. S/p successful DCCV on 01/23/23. Next day back in Afib per patient. No missed doses of Eliquis. He feels tired when in Afib.   Today, he denies symptoms of palpitations, chest pain, shortness of breath, orthopnea, PND, lower extremity edema, dizziness, presyncope, syncope, snoring, daytime somnolence, bleeding, or neurologic sequela. The patient is tolerating medications without difficulties and is otherwise without complaint today.    Atrial Fibrillation Risk Factors:  he does have symptoms or diagnosis of sleep apnea.  he has a BMI of Body mass index is 26 kg/m.Marland Kitchen Filed Weights   02/12/23 1023  Weight: 82.2 kg     Current Outpatient Medications  Medication Sig Dispense Refill   albuterol (VENTOLIN HFA) 108 (90 Base) MCG/ACT inhaler Inhale 1-2 puffs into the lungs every 6 (six) hours as needed for shortness of breath.     apixaban (ELIQUIS) 5 MG TABS tablet Take 5 mg by mouth 2 (two) times daily.     atorvastatin (LIPITOR) 10 MG tablet Take 10 mg by mouth daily.     enalapril (VASOTEC) 10 MG tablet Take 20 mg by mouth daily.      ipratropium (ATROVENT) 0.06 % nasal  spray Place 2 sprays into both nostrils daily as needed for rhinitis.     metoprolol succinate (TOPROL-XL) 25 MG 24 hr tablet Take 25 mg by mouth daily.     Multiple Vitamins-Minerals (MULTIVITAMIN WITH MINERALS) tablet Take 1 tablet by mouth daily.     omeprazole (PRILOSEC) 40 MG capsule Take 40 mg by mouth daily.     No current facility-administered medications for this encounter.    Atrial Fibrillation Management history:  Previous antiarrhythmic drugs: None Previous cardioversions: 01/23/23 Previous ablations: None Anticoagulation history: Eliquis  ROS- All systems are reviewed and negative except as per the HPI above.  Physical Exam: BP (!) 154/86   Pulse (!) 51   Ht 5\' 10"  (1.778 m)   Wt 82.2 kg   BMI 26.00 kg/m   GEN: Well nourished, well developed in no acute distress NECK: No JVD; No carotid bruits CARDIAC: Irregularly irregular bradycardic rate and rhythm, no murmurs, rubs, gallops RESPIRATORY:  Clear to auscultation without rales, wheezing or rhonchi  ABDOMEN: Soft, non-tender, non-distended EXTREMITIES:  No edema; No deformity   EKG today demonstrates  Vent. rate 51 BPM PR interval * ms QRS duration 88 ms QT/QTcB 398/366 ms P-R-T axes * -13 8 Atrial fibrillation with slow ventricular response Abnormal ECG When compared with ECG of 23-Jan-2023 10:10, PREVIOUS ECG IS PRESENT  Echo (TEE) 01/23/23:  1. Left ventricular ejection fraction, by estimation, is 60 to 65%. The  left ventricle has normal  function. The left ventricle has no regional  wall motion abnormalities.   2. Right ventricular systolic function is normal. The right ventricular  size is normal.   3. No left atrial/left atrial appendage thrombus was detected.   4. The mitral valve is normal in structure. Mild mitral valve  regurgitation. No evidence of mitral stenosis.   5. The aortic valve is normal in structure. Aortic valve regurgitation is  not visualized. No aortic stenosis is present.   6.  The inferior vena cava is normal in size with greater than 50%  respiratory variability, suggesting right atrial pressure of 3 mmHg.   Cardiac monitor 12/2022: Patch Wear Time:  6 days and 23 hours (2024-07-17T21:06:23-0400 to 2024-07-24T20:46:29-0400)   Atrial Fibrillation occurred continuously (100% burden), ranging from 34-122 bpm (avg of 63 bpm). 45 Pauses occurred, the longest lasting 3.9 secs (15 bpm). Isolated VEs were rare (<1.0%, 183), VE Couplets were rare (<1.0%, 9), and VE Triplets were rare  (<1.0%, 1).   ASSESSMENT & PLAN CHA2DS2-VASc Score = 2  The patient's score is based upon: CHF History: 0 HTN History: 1 Diabetes History: 0 Stroke History: 0 Vascular Disease History: 0 Age Score: 1 Gender Score: 0      ASSESSMENT AND PLAN: Persistent Atrial Fibrillation (ICD10:  I48.19) The patient's CHA2DS2-VASc score is 2, indicating a 2.2% annual risk of stroke.   S/p successful TEE/DCCV on 01/23/23.   He is currently in Afib (early return).  We discussed treatment options at length. He could consider Multaq, Tikosyn, or amiodarone as a bridge to ablation. He is interested in ablation so we can help refer to EP to discuss further. I went over the above medications in detail and they will call insurance and talk it over on which medication to use as a bridge. He will call with decision.    Secondary Hypercoagulable State (ICD10:  D68.69) The patient is at significant risk for stroke/thromboembolism based upon his CHA2DS2-VASc Score of 2.  Continue Apixaban (Eliquis).  No missed doses.    Will refer to EP to discuss ablation. Patient will call with medication choice.    [place] Bells, PA-C  Afib Clinic [facility] [address] [place], [place] [address] [phone]

## 2023-03-06 ENCOUNTER — Telehealth: Payer: Self-pay | Admitting: Cardiovascular Disease

## 2023-03-06 NOTE — Telephone Encounter (Signed)
Spoke with patient and he would like to know his other options for him because Eliquis is really expensive. He stated someone told him about Prasugrel. He would like to know whatother options you recommend?

## 2023-03-06 NOTE — Telephone Encounter (Signed)
Donut hole anticoag options include:  Eliquis: Can apply for patient assistance through [employer]. The website for the application is http://[email or web address].wilson-[email or web address]/. The website lets them know if they meet the income requirement. Note to Medicare Patients: In addition to the application, they need to submit documentation showing they have spent 3% of their annual household income on out-of-pocket prescription expenses for them and/or other members of their household. Their pharmacy can provide this report.  Xarelto: Similar to Eliquis, but it is taken just once a day. The drug company offers a program called Xarelto with Me Coverage Gap Support. It runs from April 1-December 31. Patients in the coverage gap can get Xarelto for $89 (for 30 days) or $250 (for 90 days). They can call to enroll at: 888-XARELTO [phone])  Dabigatran: Generic of Pradaxa. GoodRx coupon bypasses insurance and brings cost down to ~$70/month if insurance doesn't cover it at a better rate.  Warfarin: Generic and cheap alternative, however it requires frequent in office lab monitoring. It's less effective than the above options and has more food and drug interactions. There may be a copay associated with lab monitoring appts.

## 2023-03-06 NOTE — Telephone Encounter (Signed)
Left voicemail to return call to office

## 2023-03-06 NOTE — Telephone Encounter (Signed)
Pt c/o medication issue:  1. Name of Medication: apixaban (ELIQUIS) 5 MG TABS tablet   2. How are you currently taking this medication (dosage and times per day)? Take 5 mg by mouth 2 (two) times daily.   3. Are you having a reaction (difficulty breathing--STAT)? No  4. What is your medication issue? Pt states he found a generic version of this medication and was wondering if he could take it instead because this medication is a little pricey. Please advise

## 2023-03-14 ENCOUNTER — Telehealth: Payer: Self-pay | Admitting: Cardiovascular Disease

## 2023-03-14 NOTE — Telephone Encounter (Signed)
Patient is calling back to see if he can get a generic brand for Eliquis. Please call back

## 2023-03-14 NOTE — Telephone Encounter (Signed)
Spoke with patient and he was calling about alternatives being that Eliquis was so expensive. Mychart message was sent to patient with alternatives. He states he does not use mychart. I created letter with information to send to the patient. Letter place in mail for today.

## 2023-05-13 NOTE — Progress Notes (Unsigned)
Cardiology Office Note:  .   Date:  05/14/2023  ID:  Eddie Dyer, DOB 1951-02-01, MRN [id number] PCP: [facility]  [facility] Providers Cardiologist:  None    History of Present Illness: .   Eddie Dyer is a 72 y.o. male with a history of atrial fibrillation, systemic hypertension and hypercholesterolemia as well as evidence of moderate coronary atherosclerosis based on a calcium score performed in 2023 (agate stent score 158, 47th percentile).  He has not had clinically evident coronary artery disease or peripheral arterial disease.  He underwent DC cardioversion 01/23/2023, but on AFib clinic follow up 02/12/2023 was back in atrial fibrillation (patient estimates one day in normal rhythm, but did not really feel any improvement in symptoms during that 1 day). He initially expressed possible interest in ablation, but has not seen EP yet, but currently does not seem so interested in.  Today he is in rate controlled atrial fibrillation with a ventricular of 59 bpm.  At his last A-fib clinic appointment his rate was only 51 bpm at rest.  When he wore the event monitor in July his average ventricular rate was 63 bpm and during sleep he had several pauses up to 3.9 seconds in duration.   He has never been aware of palpitations.  He denies dizziness or syncope.  He has not had any problems with shortness of breath or chest pain at rest with activity, but does note that his exercise ability is diminished compared to years past.  He can still walk the dogs without having to stop and catch his breath.  He has not had any falls or injuries or bleeding while on Eliquis anticoagulation.   He was incidentally found to have an irregular rhythm during a physical examination 10/16/2022.  He was not aware of palpitations.  A previous electrocardiogram performed in 10/16/2020 showed normal sinus rhythm.  Resting heart rate on presentation was 91 bpm before starting metoprolol.  His  coronary calcium score was average at 158 (47th percentile), roughly evenly distributed throughout the coronary tree.  Incidentally noted abdominal aortic atherosclerosis.  His most recent lipid profile from 10/16/2022 shows an LDL cholesterol of 110 and HDL of 63.  He does not have diabetes mellitus.  He has normal renal function with a creatinine of 1.13.  He had a normal TSH 10/16/2022.  ROS: He has some problems with allergic rhinitis and postnasal drip.  Has mild erectile dysfunction (difficulty maintaining erection, sometimes needs to take sildenafil).  Studies Reviewed: Marland Kitchen   EKG Interpretation Date/Time:  Monday May 14 2023 11:29:34 EST Ventricular Rate:  59 PR Interval:    QRS Duration:  82 QT Interval:  400 QTC Calculation: 396 R Axis:   64  Text Interpretation: Atrial fibrillation with slow ventricular response When compared with ECG of 12-Feb-2023 10:41, Questionable change in QRS axis Confirmed by Cydnie Deason [phone]) on 05/14/2023 11:44:04 AM    EKG Interpretation Date/Time:  Monday May 14 2023 11:29:34 EST Ventricular Rate:  59 PR Interval:    QRS Duration:  82 QT Interval:  400 QTC Calculation: 396 R Axis:   64  Text Interpretation: Atrial fibrillation with slow ventricular response When compared with ECG of 12-Feb-2023 10:41, Questionable change in QRS axis Confirmed by Sheba Whaling ([id number]) on 05/14/2023 11:44:04 AM       ECG 02/12/2023  shows atrial fibrillation, slow ventricular response  Coronary calcium score 02/06/2022 Calcium score 158, 47 percentile  CT abdomen and pelvis  10/16/2020 shows aortic atherosclerosis  Risk Assessment/Calculations:    CHA2DS2-VASc Score = 2   This indicates a 2.2% annual risk of stroke. The patient's score is based upon: CHF History: 0 HTN History: 1 Diabetes History: 0 Stroke History: 0 Vascular Disease History: 0 Age Score: 1 Gender Score: 0            Physical Exam:   VS:  BP 133/66   Pulse (!)  59   Ht 5\' 8"  (1.727 m)   Wt 185 lb 6.4 oz (84.1 kg)   SpO2 97%   BMI 28.19 kg/m    Wt Readings from Last 3 Encounters:  05/14/23 185 lb 6.4 oz (84.1 kg)  02/12/23 181 lb 3.2 oz (82.2 kg)  01/22/23 176 lb 12.8 oz (80.2 kg)    GEN: Well nourished, well developed in no acute distress NECK: No JVD; No carotid bruits CARDIAC: Irregular, normal S1-S2, no murmurs, rubs, gallops.  2+ pulses in the radials, ulnar wrist, dorsalis pedis and posterior tibials bilaterally.  There are no audible carotid bruits or femoral bruits. RESPIRATORY:  Clear to auscultation without rales, wheezing or rhonchi  ABDOMEN: Soft, non-tender, non-distended EXTREMITIES:  No edema; No deformity   ASSESSMENT AND PLAN: .    1. Persistent atrial fibrillation (HCC)   2. Acquired thrombophilia (HCC)   3. Coronary artery calcification seen on CAT scan   4. Essential hypertension   5. Aortic atherosclerosis (HCC)   6. Hypercholesterolemia       A-fib: Persistent, with early recurrence following cardioversion.  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  Will reduce the metoprolol succinate to 12.5 mg once daily and repeat a monitor.  On echocardiogram he did not have any meaningful structural heart abnormalities so theoretically he might be a good ablation candidate, although the persistent arrhythmia makes successful ablation less likely.  Also reviewed the potential to use antiarrhythmic medications.  At this point he clearly prefers to continue conservative treatment with anticoagulation and rate control medications.  We discussed the fact that it is possible he will eventually need a pacemaker if his AV nodal conduction problems progress.  Anticoagulation: No serious bleeding problems.  Continue Eliquis.  Discussed the theoretical alternative of Watchman device if he should develop bleeding issues. Coronary calcification:  He has not complained of chest discomfort recently.  He has an average coronary calcium score.  Coronary CTA will not give [place] a good result due to his arrhythmia.  We can consider a PET or SPECT scan, but since he does not have symptoms, it probably would not change our treatment protocol. HTN: Very well-controlled. Aortic atherosclerosis: Incidentally noted on CT abdomen and pelvis from 2022, normal caliber aorta. HLP: Regardless of findings on further workup, it's probably wise to intensify lipid-lowering therapy to achieve target LDL less than 70.  He tells me he has repeat lipid profile scheduled with his PCP early next year.  If LDL is greater than 70, would increase the dose of atorvastatin.     Informed Consent   Shared Decision Making/Informed Consent The risks (stroke, cardiac arrhythmias rarely resulting in the need for a temporary or permanent pacemaker, skin irritation or burns and complications associated with conscious sedation including aspiration, arrhythmia, respiratory failure and death), benefits (restoration of normal sinus rhythm) and alternatives of a direct current cardioversion were explained in detail to Eddie Dyer and he agrees to proceed.  Signed, Thurmon Fair, MD

## 2023-05-14 ENCOUNTER — Ambulatory Visit: Payer: Medicare Other | Attending: Cardiovascular Disease | Admitting: Cardiovascular Disease

## 2023-05-14 ENCOUNTER — Ambulatory Visit (INDEPENDENT_AMBULATORY_CARE_PROVIDER_SITE_OTHER): Payer: Medicare Other

## 2023-05-14 ENCOUNTER — Encounter: Payer: Self-pay | Admitting: Cardiovascular Disease

## 2023-05-14 VITALS — BP 133/66 | HR 59 | Ht 68.0 in | Wt 185.4 lb

## 2023-05-14 DIAGNOSIS — I10 Essential (primary) hypertension: Secondary | ICD-10-CM | POA: Diagnosis not present

## 2023-05-14 DIAGNOSIS — D6869 Other thrombophilia: Secondary | ICD-10-CM

## 2023-05-14 DIAGNOSIS — I2510 Atherosclerotic heart disease of native coronary artery without angina pectoris: Secondary | ICD-10-CM

## 2023-05-14 DIAGNOSIS — I700 Atherosclerosis of aorta: Secondary | ICD-10-CM

## 2023-05-14 DIAGNOSIS — E7800 Pure hypercholesterolemia, unspecified: Secondary | ICD-10-CM

## 2023-05-14 DIAGNOSIS — I4819 Other persistent atrial fibrillation: Secondary | ICD-10-CM

## 2023-05-14 MED ORDER — METOPROLOL SUCCINATE ER 25 MG PO TB24: 12.5000 mg | ORAL_TABLET | Freq: Every day | ORAL | 3 refills | Status: DC

## 2023-05-14 NOTE — Patient Instructions (Signed)
Medication Instructions:  Decrease Metoprolol Succinate to 12.5 mg daily *If you need a refill on your cardiac medications before your next appointment, please call your pharmacy*  Testing/Procedures: Your physician has recommended that you wear a 3 DAY ZIO-PATCH monitor. The Zio patch cardiac monitor continuously records heart rhythm data for up to 14 days, this is for patients being evaluated for multiple types heart rhythms. For the first 24 hours post application, please avoid getting the Zio monitor wet in the shower or by excessive sweating during exercise. After that, feel free to carry on with regular activities. Keep soaps and lotions away from the ZIO XT Patch.  This will be mailed to you, please expect 7-10 days to receive.    Applying the monitor   Shave hair from upper left chest.   Hold abrader disc by orange tab.  Rub abrader in 40 strokes over left upper chest as indicated in your monitor instructions.   Clean area with 4 enclosed alcohol pads .  Use all pads to assure are is cleaned thoroughly.  Let dry.   Apply patch as indicated in monitor instructions.  Patch will be place under collarbone on left side of chest with arrow pointing upward.   Rub patch adhesive wings for 2 minutes.Remove white label marked "1".  Remove white label marked "2".  Rub patch adhesive wings for 2 additional minutes.   While looking in a mirror, press and release button in center of patch.  A small green light will flash 3-4 times .  This will be your only indicator the monitor has been turned on.     Do not shower for the first 24 hours.  You may shower after the first 24 hours.   Press button if you feel a symptom. You will hear a small click.  Record Date, Time and Symptom in the Patient Log Book.   When you are ready to remove patch, follow instructions on last 2 pages of Patient Log Book.  Stick patch monitor onto last page of Patient Log Book.   Place Patient Log Book in [place] box.  Use  locking tab on box and tape box closed securely.  The [employer] and [employer] has [employer] on it.  Please place in mailbox as soon as possible.  Your physician should have your test results approximately 7 days after the monitor has been mailed back to [facility].   Call [facility] Customer Care at [phone] if you have questions regarding your ZIO XT patch monitor.  Call them immediately if you see an orange light blinking on your monitor.   If your monitor falls off in less than 4 days contact our Monitor department at [phone].  If your monitor becomes loose or falls off after 4 days call [employer] at [phone] for suggestions on securing your monitor    Follow-Up: At [facility], you and your health needs are our priority.  As part of our continuing mission to provide you with exceptional heart care, we have created designated Provider Care Teams.  These Care Teams include your primary Cardiologist (physician) and Advanced Practice Providers (APPs -  Physician Assistants and Nurse Practitioners) who all work together to provide you with the care you need, when you need it.  We recommend signing up for the patient portal called "MyChart".  Sign up information is provided on this After Visit Summary.  MyChart is used to connect with patients for Virtual Visits (Telemedicine).  Patients are able to view  lab/test results, encounter notes, upcoming appointments, etc.  Non-urgent messages can be sent to your provider as well.   To learn more about what you can do with MyChart, go to [email or web address].    Your next appointment:   1 year(s)  Provider:   Dr Royann Shivers

## 2023-05-14 NOTE — Progress Notes (Unsigned)
Enrolled for [employer] to mail a ZIO XT long term holter monitor to the patients address on file.  

## 2023-05-20 DIAGNOSIS — I4819 Other persistent atrial fibrillation: Secondary | ICD-10-CM

## 2023-05-31 ENCOUNTER — Telehealth: Payer: Self-pay | Admitting: Emergency Medicine

## 2023-05-31 NOTE — Telephone Encounter (Signed)
There was atrial fibrillation throughout the entire recording, never excessively fast and frequently with slow ventricular rates.  I think Mr. Eddie Dyer should stop the metoprolol completely.  It is possible that he will need a pacemaker at some point in the future, as we discussed in clinic. Please discontinue metoprolol   Went over the information above with the patient. He will discontinue the Metoprolol. He verbalized understanding of all information

## 2023-06-19 ENCOUNTER — Telehealth: Payer: Self-pay | Admitting: Cardiovascular Disease

## 2023-06-19 NOTE — Telephone Encounter (Signed)
Patient called and want Dr. Royann Shivers to write a letter so that he don't have to go to jury duty due to heart conditions

## 2023-07-03 ENCOUNTER — Telehealth: Payer: Self-pay | Admitting: Cardiovascular Disease

## 2023-07-03 NOTE — Telephone Encounter (Signed)
 Patient's wife walked in to office to drop off paperwork but also had concerns to share with provider about her husband. Patient is not present during this walk-in encounter.  Wife reports that patient has issues with belching. This is NOT new, has been going on forever but reported to be progressive. Wife states this is not correlated with eating, can occur throughout the day, and causes his chest to hurt. When asks if this limits his normal activities, wife states that it does not but that patient is a pusher and just keeps going. She states he has to rest more often. She reports he takes reflux meds (prilosec is on list) and has seen GI MD. She is concerned that something is being missed or that the belching is related to AFib.   Advised will send message to MD for review.

## 2023-07-03 NOTE — Telephone Encounter (Signed)
 Paper Work Dropped Off: application for excuse of Eddie Dyer summons   Date: 07/03/2023  Location of paper:  Dr. Salena Saner Box   (Spoke to Dr. Salena Saner ahead of time and he confirmed he would sign off on this for this pt)

## 2023-07-04 ENCOUNTER — Encounter: Payer: Self-pay | Admitting: Cardiovascular Disease

## 2023-07-04 NOTE — Telephone Encounter (Signed)
 Called Eddie Dyer (dpr) informed that [employer] are at front desk. She verbalized understanding.

## 2023-07-04 NOTE — Telephone Encounter (Signed)
 Suspect it is not a cardiac related issue.

## 2023-07-06 ENCOUNTER — Telehealth: Payer: Self-pay

## 2023-07-06 NOTE — Telephone Encounter (Signed)
Received notification that patient had not ready the [facility] message I had sent. Called and he states that someone else did agree to write the jury duty excuse letter and they have already picked it up.

## 2024-04-22 ENCOUNTER — Telehealth (HOSPITAL_BASED_OUTPATIENT_CLINIC_OR_DEPARTMENT_OTHER): Payer: Self-pay | Admitting: *Deleted

## 2024-04-22 DIAGNOSIS — Z01818 Encounter for other preprocedural examination: Secondary | ICD-10-CM

## 2024-04-22 NOTE — Telephone Encounter (Signed)
 Primary Cardiologist:None  Chart reviewed as part of pre-operative protocol coverage. Because of Markeis D Medlen's past medical history and time since last visit, he/she will require a follow-up visit in order to better assess preoperative cardiovascular risk.  Pre-op covering staff: - Please schedule appointment and call patient to inform them. - Please contact requesting surgeon's office via preferred method (i.e, phone, fax) to inform them of need for appointment prior to surgery.  Patient will need CBC and BMET updated in order for pharmacy to provide guidance for holding [facility] (last labs per [facility] records 04/2023).   Rosaline EMERSON Bane, NP-C  04/22/2024, 1:23 PM [address] [place], [place] [address] Office [phone] Fax [phone]

## 2024-04-22 NOTE — Telephone Encounter (Signed)
 Left the patient a detailed message to call back and schedule an appointment for clearance.

## 2024-04-22 NOTE — Telephone Encounter (Signed)
   Pre-operative Risk Assessment    Patient Name: Eddie Dyer  DOB: 04/08/1951 MRN: [id number]   Date of last office visit: 05/14/23 DR. CROITORU Date of next office visit: NONE   Request for Surgical Clearance    Procedure:  COLONOSCOPY  Date of Surgery:  Clearance 05/26/24                                Surgeon:  DR. MANN Surgeon's Group or Practice Name:  [facility] Phone number:  [phone] Fax number:  [phone]   Type of Clearance Requested:   - Medical  - Pharmacy:  Hold Apixaban (Eliquis)     Type of Anesthesia:  PROPOFOL    Additional requests/questions:    Bonney Niels Jest   04/22/2024, 12:58 PM

## 2024-04-24 NOTE — Telephone Encounter (Signed)
2nd attempt to reach pt regarding surgical clearance and the need for an IN OFFICE appointment.  Left pt a detailed message to call back and get that scheduled.

## 2024-04-28 NOTE — Telephone Encounter (Signed)
 Pt has been scheduled in office appt with Lum Louis, NP 05/12/24 @ 1:55. Pt has been given the [address] address.

## 2024-04-28 NOTE — Telephone Encounter (Signed)
 I called the pt right back as I was able to find a cancellation 05/05/24 Dr. Elston schedule at 10:40. Pt accepted the appt with DR. C 05/05/24. I cancelled the 05/12/24 with Lum Louis, NP. Pt thanked me for getting him with Dr. JAYSON.

## 2024-05-05 ENCOUNTER — Ambulatory Visit: Attending: Cardiovascular Disease | Admitting: Cardiovascular Disease

## 2024-05-05 ENCOUNTER — Encounter: Payer: Self-pay | Admitting: Cardiovascular Disease

## 2024-05-05 VITALS — BP 135/70 | HR 53 | Ht 68.0 in | Wt 180.0 lb

## 2024-05-05 DIAGNOSIS — N529 Male erectile dysfunction, unspecified: Secondary | ICD-10-CM

## 2024-05-05 DIAGNOSIS — I700 Atherosclerosis of aorta: Secondary | ICD-10-CM | POA: Diagnosis not present

## 2024-05-05 DIAGNOSIS — D6869 Other thrombophilia: Secondary | ICD-10-CM

## 2024-05-05 DIAGNOSIS — I2510 Atherosclerotic heart disease of native coronary artery without angina pectoris: Secondary | ICD-10-CM | POA: Diagnosis not present

## 2024-05-05 DIAGNOSIS — I4819 Other persistent atrial fibrillation: Secondary | ICD-10-CM

## 2024-05-05 DIAGNOSIS — E7800 Pure hypercholesterolemia, unspecified: Secondary | ICD-10-CM

## 2024-05-05 DIAGNOSIS — I10 Essential (primary) hypertension: Secondary | ICD-10-CM

## 2024-05-05 MED ORDER — SILDENAFIL CITRATE 50 MG PO TABS: 50.0000 mg | ORAL_TABLET | Freq: Every day | ORAL | 0 refills | Status: AC | PRN

## 2024-05-05 NOTE — Patient Instructions (Signed)
 Medication Instructions:  Start Sildenafil 50 mg take one tablet as needed *If you need a refill on your cardiac medications before your next appointment, please call your pharmacy*    Follow-Up: At [facility], you and your health needs are our priority.  As part of our continuing mission to provide you with exceptional heart care, our providers are all part of one team.  This team includes your primary Cardiologist (physician) and Advanced Practice Providers or APPs (Physician Assistants and Nurse Practitioners) who all work together to provide you with the care you need, when you need it.  Your next appointment:   1 year(s)  Provider:   Jerel Balding, MD

## 2024-05-05 NOTE — Progress Notes (Unsigned)
 Cardiology Office Note:  .   Date:  05/06/2024  ID:  Eddie Dyer, DOB 05/15/1951, MRN [id number] PCP: [facility]  [facility] Providers Cardiologist:  Jerel Balding, MD    History of Present Illness: .   Eddie Dyer is a 73 y.o. male with a history of atrial fibrillation, systemic hypertension and hypercholesterolemia as well as evidence of moderate coronary atherosclerosis based on a calcium score performed in 2023 (agate stent score 158, 47th percentile).  He has not had clinically evident coronary artery disease or peripheral arterial disease.  He underwent cardioversion in August 2024 with early recurrence of atrial fibrillation probably within 1 day.  He did not really feel any symptom improvement with the change in rhythm.  His arrhythmia monitor showed atrial fibrillation with slow ventricular response and an average ventricular rate of 163 bpm as well as some pauses during sleep up to 3.9 seconds in duration so we stopped his metoprolol .  He presents again today with atrial fibrillation with slow ventricular response.  He denies any problems with dizziness, lightheadedness, syncope or excessive fatigue.  He walks 1.5 miles a day 3 days a week.  When he checks his blood pressure at home it is typically around 130/70 and his heart rate is usually in the 50s or 60s.  He denies lower extremity edema, orthopnea, PND.  He has never been aware of palpitations.  Has not had any falls or serious bleeding problems on Eliquis.  Metabolic control is fair, but is not ideal.  His LDL cholesterol is 108 and HDL is 64 on 04/23/2023.  He is currently on atorvastatin 10 mg once daily.  He does not have diabetes mellitus and he has normal renal function.  He was incidentally found to have an irregular rhythm during a physical examination 10/16/2022.  He was not aware of palpitations.  A previous electrocardiogram performed in 10/16/2020 showed normal sinus rhythm.  Resting  heart rate on presentation was 91 bpm before starting metoprolol .  His coronary calcium score was average at 158 (47th percentile), roughly evenly distributed throughout the coronary tree.  Incidentally noted abdominal aortic atherosclerosis.   ROS: He has some problems with allergic rhinitis and postnasal drip.  Has mild erectile dysfunction (difficulty maintaining erection, sometimes needs to take sildenafil).  Studies Reviewed: SABRA   EKG Interpretation Date/Time:  Monday May 05 2024 11:07:43 EST Ventricular Rate:  53 PR Interval:    QRS Duration:  94 QT Interval:  404 QTC Calculation: 379 R Axis:   58  Text Interpretation: Atrial fibrillation with slow ventricular response When compared with ECG of 14-May-2023 11:29, No significant change was found Confirmed by Sharayah Renfrow [phone]) on 05/05/2024 11:19:18 AM    EKG Interpretation Date/Time:  Monday May 05 2024 11:07:43 EST Ventricular Rate:  53 PR Interval:    QRS Duration:  94 QT Interval:  404 QTC Calculation: 379 R Axis:   58  Text Interpretation: Atrial fibrillation with slow ventricular response When compared with ECG of 14-May-2023 11:29, No significant change was found Confirmed by Empress Newmann [phone]) on 05/05/2024 11:19:18 AM       ECG 02/12/2023  shows atrial fibrillation, slow ventricular response  Coronary calcium score 02/06/2022 Calcium score 158, 47 percentile  CT abdomen and pelvis 10/16/2020 shows aortic atherosclerosis  Risk Assessment/Calculations:    CHA2DS2-VASc Score = 3  The patient's score is based upon: CHF History: 0 HTN History: 1 Diabetes History: 0 Stroke History: 0 Vascular Disease  History: 1 Age Score: 1 Gender Score: 0                Physical Exam:   VS:  BP 135/70 (BP Location: Left Arm, Patient Position: Sitting, Cuff Size: Normal)   Pulse (!) 53   Ht 5' 8 (1.727 m)   Wt 180 lb (81.6 kg)   SpO2 96%   BMI 27.37 kg/m    Wt Readings from Last 3 Encounters:   05/05/24 180 lb (81.6 kg)  05/14/23 185 lb 6.4 oz (84.1 kg)  02/12/23 181 lb 3.2 oz (82.2 kg)     General: Alert, oriented x3, no distress, mildly overweight Head: no evidence of trauma, PERRL, EOMI, no exophtalmos or lid lag, no myxedema, no xanthelasma; normal ears, nose and oropharynx Neck: normal jugular venous pulsations and no hepatojugular reflux; brisk carotid pulses without delay and no carotid bruits Chest: clear to auscultation, no signs of consolidation by percussion or palpation, normal fremitus, symmetrical and full respiratory excursions Cardiovascular: normal position and quality of the apical impulse, irregular rhythm, normal first and second heart sounds, no murmurs, rubs or gallops Abdomen: no tenderness or distention, no masses by palpation, no abnormal pulsatility or arterial bruits, normal bowel sounds, no hepatosplenomegaly Extremities: no clubbing, cyanosis or edema; 2+ radial, ulnar and brachial pulses bilaterally; 2+ right femoral, posterior tibial and dorsalis pedis pulses; 2+ left femoral, posterior tibial and dorsalis pedis pulses; no subclavian or femoral bruits Neurological: grossly nonfocal Psych: Normal mood and affect   ASSESSMENT AND PLAN: .    1. Persistent atrial fibrillation (HCC)   2. Acquired thrombophilia   3. Aortic atherosclerosis   4. Coronary artery calcification seen on CAT scan   5. Essential hypertension   6. Hypercholesterolemia   7. Erectile dysfunction, unspecified erectile dysfunction type       A-fib: It was discovered incidentally and possibly had been going on for a long time when he first underwent cardioversion, which was followed by early recurrence of atrial fibrillation, without any real change in symptoms on the rhythm normalized.  Has slow ventricular response consistent with underlying conduction system disease.  At this point pacemaker therapy does not appear indicated since he is asymptomatic.  Avoid beta-blockers or  other medications with negative chronotropic effect.  On echocardiogram he did not have any meaningful structural heart abnormalities so theoretically he might be a good ablation candidate, although the persistent arrhythmia makes successful ablation less likely.  He prefers conservative management rather than ablation or antiarrhythmics.  We discussed the fact that it is likely he will eventually need a pacemaker as his AV nodal conduction problems progress with advancing age.  Anticoagulation: No bleeding problems.  Continue Eliquis. Coronary calcification: Asymptomatic.  The focus is on risk factor modification. HTN: Well-controlled. Aortic atherosclerosis: Incidentally noted on imaging studies, normal caliber aorta HLP: Target LDL cholesterol less than 70 due to the presence of aortic and coronary atherosclerosis.  He is due for repeat labs in the next few days.  If his LDL cholesterol is still above 70 recommend increasing the dose of atorvastatin. ED: He asked about the use of PDE 5 inhibitors for erectile dysfunction.  We discussed the fact that these medication should never be combined with nitroglycerin or nitrate containing medications.  Also reviewed potential side effects.  Will give him a small supply of medications to see if they are effective.  Prefer sildenafil as a first route for its shorter duration of action, just in case side effects do  occur.  If he does not have any problems, can switch to a longer acting agent in the future.     Patient Instructions  Medication Instructions:  Start Sildenafil 50 mg take one tablet as needed *If you need a refill on your cardiac medications before your next appointment, please call your pharmacy*    Follow-Up: At [facility], you and your health needs are our priority.  As part of our continuing mission to provide you with exceptional heart care, our providers are all part of one team.  This team includes your primary Cardiologist  (physician) and Advanced Practice Providers or APPs (Physician Assistants and Nurse Practitioners) who all work together to provide you with the care you need, when you need it.  Your next appointment:   1 year(s)  Provider:   Jerel Balding, MD               Signed, Jerel Balding, MD

## 2024-05-06 ENCOUNTER — Encounter: Payer: Self-pay | Admitting: Cardiovascular Disease

## 2024-05-06 LAB — LAB REPORT - SCANNED: EGFR: 80

## 2024-05-12 ENCOUNTER — Ambulatory Visit: Admitting: Emergency Medicine

## 2024-05-14 NOTE — Telephone Encounter (Signed)
 Left message for the pt to get labs ASAP. Labs were not ordered at ov . I will place the orders and release for [facility]. I believe [facility] may be open Friday 05/16/24, so hopefully the pt can get labs Friday as this is now cutting it close due to procedure is 05/26/24.

## 2024-05-14 NOTE — Telephone Encounter (Signed)
 Low risk for colonoscopy and temporary interruption of anticoagulant.

## 2024-05-14 NOTE — Telephone Encounter (Signed)
 Requesting office inquiring if pt has been cleared after seeing Dr. Francyne 05/05/24.

## 2024-05-14 NOTE — Addendum Note (Signed)
 Addended by: WYNETTA NIELS HERO on: 05/14/2024 03:14 PM   Modules accepted: Orders

## 2024-05-19 NOTE — Telephone Encounter (Signed)
 I called PCP office to inquire that pt had labs done 05/06/24 per pt. I called PCP and someone answered and call was disconnected.   I then called back and was able to reach PCP office. I stated about labs that our office was needing and if we could have them faxed to our office [phone] attn: preop team  Pt had CBC and CMP.   Once I have the lab results I will get these scanned into the pt's chart and update the preop APP and pharm-d.

## 2024-05-19 NOTE — Telephone Encounter (Addendum)
 2nd attempt to reach the pt to have labs done, see previous notes. I will update the requesting office that the pharm-d is needing labs to be done before clearance can be signed off.

## 2024-05-19 NOTE — Telephone Encounter (Signed)
 Pt called in stating pt he had labs drawn 05/06/24 with his PCP. He asked if he still needs his labs drawn

## 2024-05-20 DIAGNOSIS — K2100 Gastro-esophageal reflux disease with esophagitis, without bleeding: Secondary | ICD-10-CM | POA: Insufficient documentation

## 2024-05-20 DIAGNOSIS — N401 Enlarged prostate with lower urinary tract symptoms: Secondary | ICD-10-CM | POA: Insufficient documentation

## 2024-05-20 DIAGNOSIS — K219 Gastro-esophageal reflux disease without esophagitis: Secondary | ICD-10-CM | POA: Insufficient documentation

## 2024-05-20 DIAGNOSIS — J301 Allergic rhinitis due to pollen: Secondary | ICD-10-CM | POA: Insufficient documentation

## 2024-05-20 DIAGNOSIS — E7800 Pure hypercholesterolemia, unspecified: Secondary | ICD-10-CM | POA: Insufficient documentation

## 2024-05-20 DIAGNOSIS — K5901 Slow transit constipation: Secondary | ICD-10-CM | POA: Insufficient documentation

## 2024-05-20 DIAGNOSIS — J329 Chronic sinusitis, unspecified: Secondary | ICD-10-CM | POA: Insufficient documentation

## 2024-05-20 DIAGNOSIS — K5730 Diverticulosis of large intestine without perforation or abscess without bleeding: Secondary | ICD-10-CM | POA: Insufficient documentation

## 2024-05-20 DIAGNOSIS — I10 Essential (primary) hypertension: Secondary | ICD-10-CM | POA: Insufficient documentation

## 2024-05-20 NOTE — Telephone Encounter (Signed)
   Patient Name: Eddie Dyer  DOB: 1950/09/10 MRN: [id number]  Primary Cardiologist: Jerel Balding, MD  Chart reviewed as part of pre-operative protocol coverage. Given past medical history and time since last visit, based on ACC/AHA guidelines, Quamaine D Aramburo is at acceptable risk for the planned procedure without further cardiovascular testing. Per Dr. Balding, Low risk for colonoscopy and temporary interruption of anticoagulant.   Per office protocol, patient can hold Eliquis for 2 days prior to procedure. Please resume Eliquis as soon as possible postprocedure, at the discretion of the surgeon.     I will route this recommendation to the requesting party via Epic fax function and remove from pre-op pool.  Please call with questions.  Damien JAYSON Braver, NP 05/20/2024, 8:48 AM

## 2024-05-20 NOTE — Telephone Encounter (Signed)
 I called the pt and made him aware he has been cleared and notes have been sent to Dr. Kristie with blood thinner recommendations.

## 2024-05-20 NOTE — Telephone Encounter (Signed)
 Patient with diagnosis of A fib on Eliquis for anticoagulation.    Procedure: colonoscopy Date of procedure: 05/26/24   CHA2DS2-VASc Score = 3  This indicates a 3.2% annual risk of stroke. The patient's score is based upon: CHF History: 0 HTN History: 1 Diabetes History: 0 Stroke History: 0 Vascular Disease History: 1 Age Score: 1 Gender Score: 0     CrCl 76 ml/min Platelet count 196K  Patient has not had an Afib/aflutter ablation in the last 3 months, DCCV within the last 4 weeks or a watchman implanted in the last 45 days    Per office protocol, patient can hold Eliquis for 2 days prior to procedure.     **This guidance is not considered finalized until pre-operative APP has relayed final recommendations.**

## 2024-05-20 NOTE — Telephone Encounter (Signed)
 Lab results from PCP recvied from PCP: I will update the preo APP and pharm-d.    05/06/24 CBC HBG 15.3 PLT 196  05/06/24 CMP BUN 16 CREATININE 0.99 K+ 4.5

## 2024-05-23 ENCOUNTER — Telehealth (HOSPITAL_BASED_OUTPATIENT_CLINIC_OR_DEPARTMENT_OTHER): Payer: Self-pay

## 2024-05-23 NOTE — Telephone Encounter (Signed)
 Duplicate request

## 2024-05-23 NOTE — Telephone Encounter (Signed)
   Pre-operative Risk Assessment    Patient Name: Eddie Dyer  DOB: 1950/11/21 MRN: [id number]   Date of last office visit: 05/05/24 with Dr. Francyne Date of next office visit: NA  Request for Surgical Clearance    Procedure:  colonoscopy   Date of Surgery:  Clearance 05/26/24                                 Surgeon:  Dr. Kristie Socks Group or Practice Name:  [facility] Phone number:  [phone] Fax number:  [phone]   Type of Clearance Requested:   - Medical  - Pharmacy:  Hold Apixaban (Eliquis) not indicated   Type of Anesthesia:  propofol    Additional requests/questions:    Bonney Augustin JONETTA Delores   05/23/2024, 11:36 AM
